# Patient Record
Sex: Male | Born: 1975 | Race: White | Hispanic: No | Marital: Married | Smoking: Never smoker
Health system: Southern US, Community
[De-identification: ages and names within clinical notes are randomized; demographics above are authoritative.]

## PROBLEM LIST (undated history)

## (undated) DIAGNOSIS — M545 Low back pain, unspecified: Secondary | ICD-10-CM

## (undated) DIAGNOSIS — E785 Hyperlipidemia, unspecified: Secondary | ICD-10-CM

## (undated) DIAGNOSIS — E663 Overweight: Secondary | ICD-10-CM

## (undated) DIAGNOSIS — I8393 Asymptomatic varicose veins of bilateral lower extremities: Secondary | ICD-10-CM

## (undated) DIAGNOSIS — E781 Pure hyperglyceridemia: Secondary | ICD-10-CM

## (undated) DIAGNOSIS — E786 Lipoprotein deficiency: Secondary | ICD-10-CM

## (undated) DIAGNOSIS — S8980XA Other specified injuries of unspecified lower leg, initial encounter: Secondary | ICD-10-CM

## (undated) DIAGNOSIS — R809 Proteinuria, unspecified: Secondary | ICD-10-CM

## (undated) DIAGNOSIS — R002 Palpitations: Secondary | ICD-10-CM

## (undated) DIAGNOSIS — E669 Obesity, unspecified: Secondary | ICD-10-CM

## (undated) DIAGNOSIS — R011 Cardiac murmur, unspecified: Secondary | ICD-10-CM

## (undated) DIAGNOSIS — I10 Essential (primary) hypertension: Secondary | ICD-10-CM

## (undated) DIAGNOSIS — R0789 Other chest pain: Secondary | ICD-10-CM

## (undated) DIAGNOSIS — K219 Gastro-esophageal reflux disease without esophagitis: Secondary | ICD-10-CM

## (undated) HISTORY — DX: Lipoprotein deficiency: E78.6

## (undated) HISTORY — DX: Overweight: E66.3

## (undated) HISTORY — DX: Hyperlipidemia, unspecified: E78.5

## (undated) HISTORY — DX: Gastro-esophageal reflux disease without esophagitis: K21.9

## (undated) HISTORY — DX: Cardiac murmur, unspecified: R01.1

## (undated) HISTORY — DX: Essential (primary) hypertension: I10

## (undated) HISTORY — DX: Proteinuria, unspecified: R80.9

## (undated) HISTORY — DX: Other chest pain: R07.89

## (undated) HISTORY — DX: Asymptomatic varicose veins of bilateral lower extremities: I83.93

## (undated) HISTORY — DX: Other specified injuries of unspecified lower leg, initial encounter: S89.80XA

## (undated) HISTORY — DX: Palpitations: R00.2

## (undated) HISTORY — DX: Pure hyperglyceridemia: E78.1

## (undated) HISTORY — DX: Obesity, unspecified: E66.9

## (undated) HISTORY — DX: Low back pain, unspecified: M54.50

---
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
## 1998-02-11 ENCOUNTER — Other Ambulatory Visit: Admission: RE | Admit: 1998-02-11 | Discharge: 1998-02-11 | Payer: Self-pay | Admitting: Internal Medicine

## 2002-07-23 ENCOUNTER — Other Ambulatory Visit: Admission: RE | Admit: 2002-07-23 | Discharge: 2002-07-23 | Payer: Self-pay | Admitting: Dermatology

## 2011-06-13 ENCOUNTER — Emergency Department (HOSPITAL_COMMUNITY)
Admission: EM | Admit: 2011-06-13 | Discharge: 2011-06-13 | Disposition: A | Discharge status: Home / Self Care | Payer: BC Managed Care – PPO | Attending: Emergency Medicine | Admitting: Emergency Medicine

## 2011-06-13 ENCOUNTER — Encounter: Payer: Self-pay | Admitting: *Deleted

## 2011-06-13 DIAGNOSIS — S61209A Unspecified open wound of unspecified finger without damage to nail, initial encounter: Secondary | ICD-10-CM | POA: Insufficient documentation

## 2011-06-13 DIAGNOSIS — W261XXA Contact with sword or dagger, initial encounter: Secondary | ICD-10-CM | POA: Insufficient documentation

## 2011-06-13 DIAGNOSIS — S61011A Laceration without foreign body of right thumb without damage to nail, initial encounter: Secondary | ICD-10-CM

## 2011-06-13 DIAGNOSIS — W260XXA Contact with knife, initial encounter: Secondary | ICD-10-CM | POA: Insufficient documentation

## 2011-06-13 MED ORDER — TETANUS-DIPHTH-ACELL PERTUSSIS 5-2.5-18.5 LF-MCG/0.5 IM SUSP
0.5000 mL | Freq: Once | INTRAMUSCULAR | Status: AC
  Administered 2011-06-13: 0.5 mL via INTRAMUSCULAR
  Filled 2011-06-13 (×2): qty 0.5

## 2011-06-13 MED ORDER — BACITRACIN ZINC 500 UNIT/GM EX OINT
1.0000 "application " | TOPICAL_OINTMENT | Freq: Two times a day (BID) | CUTANEOUS | Status: DC
  Administered 2011-06-13: 1 via TOPICAL
  Filled 2011-06-13: qty 15

## 2011-06-13 NOTE — ED Notes (Signed)
Unwrapped hand and redressed with sterile dressing and kerlex.

## 2011-06-13 NOTE — ED Provider Notes (Signed)
History     CSN: [id number] Arrival date & time: 06/13/2011  7:44 PM   First MD Initiated Contact with Patient 06/13/11 2200      Chief Complaint  Patient presents with  . Extremity Laceration    (Consider location/radiation/quality/duration/timing/severity/associated sxs/prior treatment) HPI Comments: Left handed patient reports he was trying to pry two frozen biscuits apart when the knife slipped and he cut the base of his right thumb.  Denies any difficulty moving thumb, decreased strength, numbness or tingling.  Denies other injury.    The history is provided by the patient.    History reviewed. No pertinent past medical history.  History reviewed. No pertinent past surgical history.  No family history on file.  History  Substance Use Topics  . Smoking status: Not on file  . Smokeless tobacco: Not on file  . Alcohol Use: Not on file      Review of Systems  All other systems reviewed and are negative.    Allergies  Review of patient's allergies indicates no known allergies.  Home Medications   Current Outpatient Rx  Name Route Sig Dispense Refill  . ACETAMINOPHEN 325 MG PO TABS Oral Take 650 mg by mouth every 6 (six) hours as needed.      . MULTIVITAMINS PO CAPS Oral Take 1 capsule by mouth daily.        BP 143/95  Pulse 81  Temp(Src) 98.1 F (36.7 C) (Oral)  Resp 16  SpO2 99%  Physical Exam  Nursing note and vitals reviewed. Constitutional: He is oriented to person, place, and time. He appears well-developed and well-nourished.  HENT:  Head: Normocephalic and atraumatic.  Neck: Neck supple.  Pulmonary/Chest: Effort normal.  Neurological: He is alert and oriented to person, place, and time.  Skin:       1.5 cm laceration on palmar surface at base of right thumb.  Full AROM, strength 5/5, sensation intact, capillary refill < 2 seconds.      ED Course  Procedures (including critical care time) LACERATION REPAIR Performed by: Rise Patience Consent: Verbal consent obtained. Risks and benefits: risks, benefits and alternatives were discussed Patient identity confirmed: provided demographic data Time out performed prior to procedure Prepped and Draped in normal sterile fashion Wound explored  Laceration Location: right thumb  Laceration Length: 1.5cm  No Foreign Bodies seen or palpated  Anesthesia: local infiltration  Local anesthetic: lidocaine 1% no epinephrine  Irrigation method: syringe Amount of cleaning: standard  Skin closure: prolene 6-0  Number of sutures or staples: 6  Technique: simple interrupted  Patient tolerance: Patient tolerated the procedure well with no immediate complications.  Labs Reviewed - No data to display No results found.   1. Laceration of right thumb       MDM  Laceration of right thumb with no tendon or neurological involvement.  Laceration repaired, tetanus vx given.          Dillard Cannon [place], [place] 06/13/11 2336

## 2011-06-13 NOTE — ED Notes (Signed)
Pt was home and attempting to separate two frozen biscuits with a table knife. Knife slipped and cut between right big thumb and index finger. Pt's wife wrapped hand and pt came to the ER for further evaluation. Pt is concerned that he may have incurred nerve damage. Pt able to wiggle all fingers on right hand. Right hand sensation intact. Laceration no longer bleeding. Laceration appears to be approximately 1/8 inch deep. Pt denies nausea or vomiting with the pain.

## 2011-06-14 NOTE — ED Provider Notes (Signed)
Medical screening examination/treatment/procedure(s) were performed by non-physician practitioner and as supervising physician I was immediately available for consultation/collaboration.   Ionia Schey, MD 06/14/11 0654 

## 2013-11-19 ENCOUNTER — Other Ambulatory Visit: Payer: Self-pay | Admitting: Internal Medicine

## 2013-11-19 DIAGNOSIS — R109 Unspecified abdominal pain: Secondary | ICD-10-CM

## 2013-11-20 ENCOUNTER — Encounter (INDEPENDENT_AMBULATORY_CARE_PROVIDER_SITE_OTHER): Payer: Self-pay

## 2013-11-20 ENCOUNTER — Ambulatory Visit
Admission: RE | Admit: 2013-11-20 | Discharge: 2013-11-20 | Disposition: A | Discharge status: Home / Self Care | Payer: BC Managed Care – PPO | Source: Ambulatory Visit | Attending: Internal Medicine | Admitting: Internal Medicine

## 2013-11-20 DIAGNOSIS — R109 Unspecified abdominal pain: Secondary | ICD-10-CM

## 2014-03-01 ENCOUNTER — Encounter: Payer: Self-pay | Admitting: *Deleted

## 2014-03-10 ENCOUNTER — Ambulatory Visit (INDEPENDENT_AMBULATORY_CARE_PROVIDER_SITE_OTHER): Payer: BC Managed Care – PPO | Admitting: Cardiology

## 2014-03-10 ENCOUNTER — Encounter: Payer: Self-pay | Admitting: Cardiology

## 2014-03-10 VITALS — BP 122/90 | HR 70 | Ht 69.0 in | Wt 213.0 lb

## 2014-03-10 DIAGNOSIS — E781 Pure hyperglyceridemia: Secondary | ICD-10-CM

## 2014-03-10 DIAGNOSIS — E669 Obesity, unspecified: Secondary | ICD-10-CM

## 2014-03-10 DIAGNOSIS — R002 Palpitations: Secondary | ICD-10-CM | POA: Insufficient documentation

## 2014-03-10 HISTORY — DX: Obesity, unspecified: E66.9

## 2014-03-10 HISTORY — DX: Pure hyperglyceridemia: E78.1

## 2014-03-10 NOTE — Progress Notes (Signed)
      [address], [address] [place], [place]  [address] Phone: [phone] Fax:  [phone]  Date:  03/10/2014   ID:  Ian Day, DOB August 08, 1975, MRN [id number]  PCP:  No primary provider on file.   History of Present Illness: Ian Day is a 38 y.o. male here for followup of palpitations. Previously, after vacation he came back and he on sensation of shortness of breath. He seemed to have skipped beats. Intermittent. Prior history of heart murmur as a child. These palpitations are accompanied with a vague sense of uneasiness in his chest. Electrolytes were normal. Triglycerides were elevated at 452. Had cut down coffee and less noticable. Notices at work. Would feel it skip every thrid or fourth beat. No tachycardia. Tightness of chest noticed with these and feels some SOB, mild, feels like he needs to take a deeper breath. May feel a head ache with these.  Previous description was most likely ventricular trigeminy or quadrigeminy. No syncope. Chest pain was atypical. I monitoring his symptoms.   Had NUC stress and ECHO both reassuring.   Palpitations have improved,  No SOB. Rare palpitations. Caffeine intake is down. Stress is down. Changing to Dr. Tenny Craw.   Wt Readings from Last 3 Encounters:  03/10/14 213 lb (96.616 kg)     Past Medical History  Diagnosis Date  . Heart murmur   . Knee hyperextension injury   . GERD (gastroesophageal reflux disease)     No past surgical history on file.  Current Outpatient Prescriptions  Medication Sig Dispense Refill  . esomeprazole (NEXIUM) 20 MG capsule Take 20 mg by mouth daily at 12 noon.       No current facility-administered medications for this visit.    Allergies:   No Known Allergies  Social History:  The patient  reports that he has never smoked. He does not have any smokeless tobacco history on file. He reports that he does not drink alcohol or use illicit drugs.   Family History  Problem Relation Age of  Onset  . Hypertension Father   . Heart attack Paternal Grandmother   . Heart attack Paternal Grandfather     ROS:  Please see the history of present illness.   Denies any syncope, bleeding, orthopnea, PND   All other systems reviewed and negative.   PHYSICAL EXAM: VS:  BP 122/90  Pulse 70  Ht  (1.753 m)  Wt 213 lb (96.616 kg)  BMI 31.44 kg/m2 Well nourished, well developed, in no acute distress HEENT: normal, [place]/AT, EOMI Neck: no JVD, normal carotid upstroke, no bruit Cardiac:  normal S1, S2; RRR; no murmur Lungs:  clear to auscultation bilaterally, no wheezing, rhonchi or rales Abd: soft, nontender, no hepatomegaly, no bruits Ext: no edema, 2+ distal pulses Skin: warm and dry GU: deferred Neuro: no focal abnormalities noted, AAO x 3  EKG:  03/10/14 - NSR, no other changes.    ASSESSMENT AND PLAN:  1. Palpitations-after decreasing stress, caffeine, doing very well. No medications at this point. Reassuring previous workup. We will see him back on as-needed basis. 2. Hypertriglycerides-recent bloodwork per patient reassuring. Diet. Previous fish oil has precipitated gout attack he states. 3. Obesity-weight has increased. Continue to monitor this. Decrease wheat products/carbohydrates. 4. We will see him back on as-needed basis.  Signed, Donato Schultz, MD [facility]  03/10/2014 10:05 AM

## 2014-03-10 NOTE — Patient Instructions (Signed)
The current medical regimen is effective;  continue present plan and medications.  Follow up as needed 

## 2014-03-24 ENCOUNTER — Encounter: Payer: Self-pay | Admitting: Cardiology

## 2014-09-02 ENCOUNTER — Ambulatory Visit (INDEPENDENT_AMBULATORY_CARE_PROVIDER_SITE_OTHER): Payer: BLUE CROSS/BLUE SHIELD | Admitting: Cardiology

## 2014-09-02 ENCOUNTER — Encounter: Payer: Self-pay | Admitting: Cardiology

## 2014-09-02 VITALS — BP 132/88 | HR 78 | Ht 69.0 in | Wt 213.0 lb

## 2014-09-02 DIAGNOSIS — E781 Pure hyperglyceridemia: Secondary | ICD-10-CM

## 2014-09-02 DIAGNOSIS — R0789 Other chest pain: Secondary | ICD-10-CM

## 2014-09-02 DIAGNOSIS — R002 Palpitations: Secondary | ICD-10-CM

## 2014-09-02 DIAGNOSIS — E669 Obesity, unspecified: Secondary | ICD-10-CM | POA: Insufficient documentation

## 2014-09-02 HISTORY — DX: Palpitations: R00.2

## 2014-09-02 HISTORY — DX: Other chest pain: R07.89

## 2014-09-02 NOTE — Patient Instructions (Signed)
The current medical regimen is effective;  continue present plan and medications.  A chest x-ray takes a picture of the organs and structures inside the chest, including the heart, lungs, and blood vessels. This test can show several things, including, whether the heart is enlarges; whether fluid is building up in the lungs; and whether pacemaker / defibrillator leads are still in place.  Follow up in 6 months with Dr. Anne FuSkains.  You will receive a letter in the mail 2 months before you are due.  Please call us when you receive this letter to schedule your follow up appointment.  Thank you for choosing [facility]!!

## 2014-09-02 NOTE — Progress Notes (Signed)
[address] [place], [place]  [id number] Phone: [phone] Fax:  [phone]) [phone]  Date:  09/02/2014   ID:  Ian CoxJonathan R Yaun, DOB 1975/12/16, MRN [id number]  PCP:   Duane Lopeoss, Alan, MD   History of Present Illness: Ian Day is a 39 y.o. male here for followup of chest pain. Previously, after vacation he came back and he on sensation of shortness of breath. He seemed to have skipped beats. Intermittent. Prior history of heart murmur as a child. These palpitations are accompanied with a vague sense of uneasiness in his chest. Electrolytes were normal. Triglycerides were elevated at 452. Had cut down coffee and less noticable. Notices at work. Would feel it skip every thrid or fourth beat. No tachycardia. Tightness of chest noticed with these and feels some SOB, mild, feels like he needs to take a deeper breath. May feel a head ache with these.  Previous description was most likely ventricular trigeminy or quadrigeminy. No syncope. Chest pain was atypical. I monitoring his symptoms.   Had NUC stress and ECHO both reassuring.   Palpitations have improved,  but occasionally he will feel chest discomfort that will last approximately 3 days duration, he will wake up and it will spontaneously go away. Sometimes he feels in his arms as well, hard to describe. He has had an upper GI series which showed intermittent esophageal sluggishness. He was given Nexium in the past.   Wt Readings from Last 3 Encounters:  09/02/14 213 lb (96.616 kg)  03/10/14 213 lb (96.616 kg)     Past Medical History  Diagnosis Date  . Heart murmur   . Knee hyperextension injury   . GERD (gastroesophageal reflux disease)     No past surgical history on file.  Current Outpatient Prescriptions  Medication Sig Dispense Refill  . esomeprazole (NEXIUM) 20 MG capsule Take 20 mg by mouth as needed.      No current facility-administered medications for this visit.    Allergies:   No Known  Allergies  Social History:  The patient  reports that he has never smoked. He does not have any smokeless tobacco history on file. He reports that he does not drink alcohol or use illicit drugs.   Small children at home.  Family History  Problem Relation Age of Onset  . Hypertension Father   . Heart attack Paternal Grandmother   . Heart attack Paternal Grandfather     ROS:  Please see the history of present illness.   Denies any syncope, bleeding, orthopnea, PND   All other systems reviewed and negative.   PHYSICAL EXAM: VS:  BP 132/88 mmHg  Pulse 78  Ht 5\' 9"  (1.753 m)  Wt 213 lb (96.616 kg)  BMI 31.44 kg/m2 Well nourished, well developed, in no acute distress HEENT: normal, [place]/AT, EOMI Neck: no JVD, normal carotid upstroke, no bruit Cardiac:  normal S1, S2; RRR; no murmur Lungs:  clear to auscultation bilaterally, no wheezing, rhonchi or rales Abd: soft, nontender, no hepatomegaly, no bruits Ext: no edema, 2+ distal pulses Skin: warm and dry GU: deferred Neuro: no focal abnormalities noted, AAO x 3  EKG:  03/10/14 - NSR, no other changes.    ASSESSMENT AND PLAN:  1. Chest pain-atypical, previous cardiac workup was reassuring. Could this be GI related? Given the fact that his pain sometimes can be constant over 3 days duration makes it very low likelihood for cardiac etiology. He is not changed anything in  his workout routine, exercise. He does sometimes feel shortness of breath with running or activity. I will check a chest x-ray to ensure proper structure of his chest cavity. 2. Palpitations-after decreasing stress, caffeine, doing very well. No medications at this point. Reassuring previous workup.  3. Hypertriglycerides-prior bloodwork per patient reassuring. Diet. Previous fish oil has precipitated gout attack he states. 4. Obesity-encouraged weight loss which will help overall with blood pressure.. Continue to monitor this. Decrease wheat products/carbohydrates. 5. We  will see him back 6 months  Signed, Donato Schultz, MD [facility]  09/02/2014 3:39 PM

## 2014-09-03 ENCOUNTER — Ambulatory Visit (INDEPENDENT_AMBULATORY_CARE_PROVIDER_SITE_OTHER)
Admission: RE | Admit: 2014-09-03 | Discharge: 2014-09-03 | Disposition: A | Discharge status: Home / Self Care | Payer: BLUE CROSS/BLUE SHIELD | Source: Ambulatory Visit | Attending: Cardiology | Admitting: Cardiology

## 2014-09-03 DIAGNOSIS — R0789 Other chest pain: Secondary | ICD-10-CM

## 2015-08-09 ENCOUNTER — Emergency Department (HOSPITAL_COMMUNITY)
Admission: EM | Admit: 2015-08-09 | Discharge: 2015-08-09 | Disposition: A | Discharge status: Home / Self Care | Payer: BLUE CROSS/BLUE SHIELD | Attending: Emergency Medicine | Admitting: Emergency Medicine

## 2015-08-09 ENCOUNTER — Encounter (HOSPITAL_COMMUNITY): Payer: Self-pay | Admitting: Emergency Medicine

## 2015-08-09 ENCOUNTER — Emergency Department (HOSPITAL_COMMUNITY): Payer: BLUE CROSS/BLUE SHIELD

## 2015-08-09 DIAGNOSIS — R2230 Localized swelling, mass and lump, unspecified upper limb: Secondary | ICD-10-CM | POA: Insufficient documentation

## 2015-08-09 DIAGNOSIS — R0981 Nasal congestion: Secondary | ICD-10-CM | POA: Insufficient documentation

## 2015-08-09 DIAGNOSIS — R079 Chest pain, unspecified: Secondary | ICD-10-CM | POA: Insufficient documentation

## 2015-08-09 DIAGNOSIS — R0602 Shortness of breath: Secondary | ICD-10-CM | POA: Diagnosis not present

## 2015-08-09 DIAGNOSIS — R002 Palpitations: Secondary | ICD-10-CM | POA: Insufficient documentation

## 2015-08-09 DIAGNOSIS — R61 Generalized hyperhidrosis: Secondary | ICD-10-CM | POA: Diagnosis not present

## 2015-08-09 DIAGNOSIS — Z87828 Personal history of other (healed) physical injury and trauma: Secondary | ICD-10-CM | POA: Insufficient documentation

## 2015-08-09 DIAGNOSIS — R1013 Epigastric pain: Secondary | ICD-10-CM | POA: Insufficient documentation

## 2015-08-09 DIAGNOSIS — K219 Gastro-esophageal reflux disease without esophagitis: Secondary | ICD-10-CM | POA: Insufficient documentation

## 2015-08-09 DIAGNOSIS — R200 Anesthesia of skin: Secondary | ICD-10-CM | POA: Insufficient documentation

## 2015-08-09 DIAGNOSIS — R011 Cardiac murmur, unspecified: Secondary | ICD-10-CM | POA: Insufficient documentation

## 2015-08-09 LAB — CBC
HEMATOCRIT: 45 % (ref 39.0–52.0)
Hemoglobin: 14.9 g/dL (ref 13.0–17.0)
MCH: 28 pg (ref 26.0–34.0)
MCHC: 33.1 g/dL (ref 30.0–36.0)
MCV: 84.4 fL (ref 78.0–100.0)
Platelets: 253 10*3/uL (ref 150–400)
RBC: 5.33 MIL/uL (ref 4.22–5.81)
RDW: 13.2 % (ref 11.5–15.5)
WBC: 8 10*3/uL (ref 4.0–10.5)

## 2015-08-09 LAB — BASIC METABOLIC PANEL
Anion gap: 10 (ref 5–15)
BUN: 17 mg/dL (ref 6–20)
CHLORIDE: 103 mmol/L (ref 101–111)
CO2: 29 mmol/L (ref 22–32)
Calcium: 9.5 mg/dL (ref 8.9–10.3)
Creatinine, Ser: 1.1 mg/dL (ref 0.61–1.24)
GFR calc Af Amer: >60 mL/min (ref >60)
GFR calc non Af Amer: >60 mL/min (ref >60)
GLUCOSE: 124 mg/dL — AB (ref 65–99)
POTASSIUM: 4 mmol/L (ref 3.5–5.1)
Sodium: 142 mmol/L (ref 135–145)

## 2015-08-09 LAB — I-STAT TROPONIN, ED: Troponin i, poc: 0 ng/mL (ref 0.00–0.08)

## 2015-08-09 MED ORDER — OMEPRAZOLE 20 MG PO CPDR: 20.0000 mg | DELAYED_RELEASE_CAPSULE | Freq: Every day | ORAL | Status: DC

## 2015-08-09 NOTE — ED Notes (Signed)
intermitent CP X1 month, c/o feeling flushed, CP 2/10, also SOB, A/O X4, ambulatory and in NAD

## 2015-08-09 NOTE — ED Provider Notes (Signed)
CSN: [id number]     Arrival date & time 08/09/15  1357 History   First MD Initiated Contact with Patient 08/09/15 1715     Chief Complaint  Patient presents with  . Chest Pain   (Consider location/radiation/quality/duration/timing/severity/associated sxs/prior Treatment) HPI 40 y.o. male presents to the Emergency Department today complaining of chest pain and shortness of breath for the past year and half. Notes that the pain has increased in strength and frequency for the past 3-4 weeks. Describes the pain as 2/10 dull cramping sensation substernal that radiates to right arm. Also notes some epigastric tenderness. Chest pain seems to disappear with rest, but recently occurs with both rest and exertion. Has seen Cardiologist (Dr. Dione Housekeeper) on multiple occasions for these symptoms. Associated symptoms include: diaphoresis, congestion, swollen hand/feet, and numbness/tingling. No N/V/D. No headaches. No abd pain. No fevers. No other symptoms notes. Last stress  Test was 1 year and a half ago, which was unremarkable. No hx MI. Has an appointment with Dr. Dione Housekeeper in 3 weeks.     Past Medical History  Diagnosis Date  . Heart murmur   . Knee hyperextension injury   . GERD (gastroesophageal reflux disease)    History reviewed. No pertinent past surgical history. Family History  Problem Relation Age of Onset  . Hypertension Father   . Heart attack Paternal Grandmother   . Heart attack Paternal Grandfather    Social History  Substance Use Topics  . Smoking status: Never Smoker   . Smokeless tobacco: None  . Alcohol Use: No    Review of Systems ROS reviewed and all are negative for acute change except as noted in the HPI.  Allergies  Review of patient's allergies indicates no known allergies.  Home Medications   Prior to Admission medications   Medication Sig Start Date End Date Taking? Authorizing Provider  esomeprazole (NEXIUM) 20 MG capsule Take 20 mg by mouth as needed.      Historical Provider, MD   BP 157/106 mmHg  Pulse 70  Temp(Src) 98.2 F (36.8 C) (Oral)  Resp 21  Ht  (1.753 m)  Wt 97.523 kg  BMI 31.74 kg/m2  SpO2 99%]  Physical Exam  Constitutional: He is oriented to person, place, and time. He appears well-developed and well-nourished.  HENT:  Head: Normocephalic and atraumatic.  Eyes: EOM are normal.  Neck: Normal range of motion.  Cardiovascular: Normal rate, regular rhythm, S1 normal, S2 normal, normal heart sounds, intact distal pulses and normal pulses.   Pulmonary/Chest: Effort normal and breath sounds normal. He has no decreased breath sounds. He has no wheezes. He has no rhonchi. He has no rales. He exhibits no tenderness.  Abdominal: Soft. Normal appearance. There is tenderness in the epigastric area. There is no rebound, no CVA tenderness, no tenderness at McBurney's point and negative Murphy's sign.  Musculoskeletal: Normal range of motion.  Neurological: He is alert and oriented to person, place, and time.  Skin: Skin is warm and dry.  Non blanching rash noted on bilateral ankles. Similar pattern on both.   Psychiatric: He has a normal mood and affect. His behavior is normal. Thought content normal.  Nursing note and vitals reviewed.   ED Course  Procedures (including critical care time) Labs Review Labs Reviewed  BASIC METABOLIC PANEL - Abnormal; Notable for the following:    Glucose, Bld 124 (*)    All other components within normal limits  CBC  I-STAT TROPOININ, ED    Imaging Review Dg Chest  2 View  08/09/2015  CLINICAL DATA:  Chest pain for 1 year, more severe past 2 weeks EXAM: CHEST  2 VIEW COMPARISON:  September 03, 2014 FINDINGS: There is no edema or consolidation. Heart size and pulmonary vascularity are normal. No adenopathy. No pneumothorax. No bone lesions. IMPRESSION: No edema or consolidation. Electronically Signed   By: Bretta Bang III M.D.   On: 08/09/2015 14:57   I have personally reviewed and  evaluated these images and lab results as part of my medical decision-making.   EKG Interpretation   Date/Time:  Tuesday August 09 2015 14:38:14 EST Ventricular Rate:  71 PR Interval:  164 QRS Duration: 108 QT Interval:  402 QTC Calculation: 436 R Axis:   65 Text Interpretation:  Normal sinus rhythm Incomplete right bundle branch  block Borderline ECG No previous ECGs available Confirmed by ZACKOWSKI   MD, SCOTT ([id number]) on 08/09/2015 6:04:30 PM      MDM  I have reviewed relevant laboratory values. I have reviewed relevant imaging studies. I personally interpreted the relevant EKG. I have reviewed the relevant previous healthcare records. I obtained HPI from historian. Patient discussed with supervising physician  ED Course:  Assessment: 61y male presents with worsening chest pain and shortness of breath for the past 3-4 weeks. He has been seeing Cardiologist (Dr.Skaines) for treatment of symptoms. Has had Stress and Echo done in the past year and a half, which were unremarkable. Trop today negative. EKG unremarkable. CXR unremarkable. Patient is to be discharged with recommendation to follow up with cardiologist in regards to today's hospital visit. Perc negative, VSS, no tracheal deviation, no JVD or new murmur, RRR, breath sounds equal bilaterally, EKG without acute abnormalities, negative troponin, and negative CXR. Pt has been advised start a PPI and return to the ED is CP becomes exertional, associated with diaphoresis or nausea, radiates to left jaw/arm, worsens or becomes concerning in any way. Pt appears reliable for follow up and is agreeable to discharge.   Disposition/Plan:  DC Home Additional Verbal discharge instructions given and discussed with patient.  Pt Instructed to follow up with Cardiologist at scheduled appointment  Return precautions given Pt acknowledges and agrees with plan  Supervising Physician Vanetta Mulders, MD   Final diagnoses:  Palpitations   Chest pain, unspecified chest pain type      Audry Pili, PA-C 08/09/15 1825  Vanetta Mulders, MD 08/13/15 [phone]

## 2015-08-09 NOTE — Discharge Instructions (Signed)
Please read and follow all provided instructions.  Your diagnoses today include:  1. Palpitations   2. Chest pain, unspecified chest pain type    Tests performed today include:  An EKG of your heart  A chest x-ray  Cardiac enzymes - a blood test for heart muscle damage  Blood counts and electrolytes  Vital signs. See below for your results today.   Medications prescribed:   Take any prescribed medications only as directed.  Follow-up instructions: Please follow-up with your Cardiologist at your scheduled appointment   Return instructions:  SEEK IMMEDIATE MEDICAL ATTENTION IF:  You have severe chest pain, especially if the pain is crushing or pressure-like and spreads to the arms, back, neck, or jaw, or if you have sweating, nausea (feeling sick to your stomach), or shortness of breath. THIS IS AN EMERGENCY. Don't wait to see if the pain will go away. Get medical help at once. Call 911 or 0 (operator). DO NOT drive yourself to the hospital.   Your chest pain gets worse and does not go away with rest.   You have an attack of chest pain lasting longer than usual, despite rest and treatment with the medications your caregiver has prescribed.   You wake from sleep with chest pain or shortness of breath.  You feel dizzy or faint.  You have chest pain not typical of your usual pain for which you originally saw your caregiver.   You have any other emergent concerns regarding your health.  Additional Information: Chest pain comes from many different causes. Your caregiver has diagnosed you as having chest pain that is not specific for one problem, but does not require admission.  You are at low risk for an acute heart condition or other serious illness.   Your vital signs today were: BP 157/106 mmHg   Pulse 70   Temp(Src) 98.2 F (36.8 C) (Oral)   Resp 21   Ht  (1.753 m)   Wt 97.523 kg   BMI 31.74 kg/m2   SpO2 99% If your blood pressure (BP) was elevated above 135/85 this  visit, please have this repeated by your doctor within one month. --------------

## 2015-08-30 ENCOUNTER — Ambulatory Visit: Payer: BLUE CROSS/BLUE SHIELD | Admitting: Cardiology

## 2016-11-13 ENCOUNTER — Telehealth: Payer: Self-pay

## 2016-11-13 NOTE — Telephone Encounter (Signed)
SENT NOTES TO SCHEDULING 

## 2016-11-21 ENCOUNTER — Encounter: Payer: Self-pay | Admitting: Cardiology

## 2016-11-21 ENCOUNTER — Encounter (INDEPENDENT_AMBULATORY_CARE_PROVIDER_SITE_OTHER): Payer: Self-pay

## 2016-11-21 ENCOUNTER — Ambulatory Visit (INDEPENDENT_AMBULATORY_CARE_PROVIDER_SITE_OTHER): Payer: BLUE CROSS/BLUE SHIELD | Admitting: Cardiology

## 2016-11-21 VITALS — BP 130/80 | HR 69 | Ht 69.0 in | Wt 218.2 lb

## 2016-11-21 DIAGNOSIS — E669 Obesity, unspecified: Secondary | ICD-10-CM

## 2016-11-21 DIAGNOSIS — R002 Palpitations: Secondary | ICD-10-CM | POA: Diagnosis not present

## 2016-11-21 DIAGNOSIS — E781 Pure hyperglyceridemia: Secondary | ICD-10-CM

## 2016-11-21 MED ORDER — METOPROLOL SUCCINATE ER 25 MG PO TB24: 25.0000 mg | ORAL_TABLET | Freq: Every day | ORAL | 3 refills | Status: DC | PRN

## 2016-11-21 NOTE — Patient Instructions (Signed)
Medication Instructions:  You may take Metoprolol succinate 25 mg once a day as needed for palpitations. Continue all other medications as listed.  Follow-Up: Follow up in 6 months with Dr. Anne FuSkains.  You will receive a letter in the mail 2 months before you are due.  Please call us when you receive this letter to schedule your follow up appointment.  If you need a refill on your cardiac medications before your next appointment, please call your pharmacy.  Thank you for choosing [facility]!!

## 2016-11-21 NOTE — Progress Notes (Signed)
[address], [address] [place], [place]  [address] Phone: [phone] Fax:  [phone]  Date:  11/21/2016   ID:  Ian Day, DOB February 23, 1976, MRN [id number]  PCP:  Ileana Ladd, MD   History of Present Illness: Ian Day is a 41 y.o. male here for followup of palpitations. Has had a workup that was negative for metanephrines.  Fairly similar to the way he was describing them previously. They seem to be sporadic, come and go. They may last a few days in duration 2 weeks then go away. Nothing seems to bring the moment specifically. His caffeine consumption has been reduced significantly but he does notice that he drinks more caffeine they can worsen. No significant dietary changes. They're more of an annoyance than anything.  Sometimes he feels a slight pressure in his chest. Nonexertional. Rare. His job has been stressful. 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  Previously, after vacation he came back and he on sensation of shortness of breath. He seemed to have skipped beats. Intermittent. Prior history of heart murmur as a child. These palpitations are accompanied with a vague sense of uneasiness in his chest. Electrolytes were normal. Triglycerides were elevated at 452. Had cut down coffee and less noticable. Notices at work. Would feel it skip every thrid or fourth beat. No tachycardia. Tightness of chest noticed with these and feels some SOB, mild, feels like he needs to take a deeper breath. May feel a head ache with these.  Previous description was most likely ventricular trigeminy or quadrigeminy. No syncope.  Had NUC stress and ECHO both reassuring.    Wt Readings from Last 3 Encounters:  11/21/16 218 lb 3.2 oz (99 kg)  08/09/15 215 lb (97.5 kg)  09/02/14 213 lb (96.6 kg)     Past Medical History:  Diagnosis Date  . Atypical chest pain 09/02/2014  . GERD (gastroesophageal reflux disease)   . Heart murmur   .  Hypertriglyceridemia 03/10/2014  . Knee hyperextension injury   . Obesity, unspecified 03/10/2014  . Palpitations 09/02/2014    History reviewed. No pertinent surgical history.  Current Outpatient Prescriptions  Medication Sig Dispense Refill  . metoprolol succinate (TOPROL-XL) 25 MG 24 hr tablet Take 1 tablet (25 mg total) by mouth daily as needed. 30 tablet 3   No current facility-administered medications for this visit.     Allergies:   No Known Allergies  Social History:  The patient  reports that he has never smoked. He has never used smokeless tobacco. He reports that he does not drink alcohol or use drugs.   Small children at home.  Family History  Problem Relation Age of Onset  . Hypertension Mother   . Hyperlipidemia Mother   . Hypertension Father   . Heart attack Paternal Grandmother   . Heart attack Paternal Grandfather     ROS:  Please see the history of present illness.   Denies any syncope, bleeding, orthopnea, PND   Unless stated above all other review of systems negative  PHYSICAL EXAM: VS:  BP 130/80   Pulse 69   Ht 5\' 9"  (1.753 m)   Wt 218 lb 3.2 oz (99 kg)   BMI 32.22 kg/m  Well nourished, well developed, in no acute distress  HEENT: normal, [place]/AT, EOMI Neck: no JVD, normal carotid upstroke, no bruit Cardiac:  normal S1, S2; RRR; no murmur  Lungs:  clear to auscultation bilaterally, no wheezing,  rhonchi or rales  Abd: soft, nontender, no hepatomegaly, no bruits  Ext: no edema, 2+ distal pulses Skin: warm and dry  GU: deferred Neuro: no focal abnormalities noted, AAO x 3  EKG:  11/21/16, today-normal sinus rhythm with no other abnormalities personally viewed-03/10/14 - NSR, no other changes.    Prior office notes reviewed, lab work reviewed-hemoglobin 15.6, TSH normal. Metanephrines normal.   ASSESSMENT AND PLAN:  Palpitations  - Fairly long-standing, by history appeared to be PVCs or PACs. We discussed the possibility of monitor but he is  currently not having any palpitations. He does not describe any high risk symptoms such as syncope associated with these.  - I will prescribe him Toprol-XL 25 mg once a day when necessary  - Continue with dietary modification such as caffeine reduction. Try to avoid Sudafed.  - Try to get good sleep, good sleep patterns. His wife does note that he snores occasionally. He does not have any significant daytime somnolence.  - He does sometimes feel anxious and has a sensation like his face is flushed and he feels some tingling in his nose and his mouth. Sometimes this is associated with anxiety and hyperventilation.  - Overall his recent cardiac workup has been reassuring with nuclear stress test and echocardiogram. I do not appreciate any new murmurs.  -He continues to exercise up to 3 days a week. Excellent.  Hypertriglycerides- -prior bloodwork per patient reassuring. Diet. Previous fish oil has precipitated gout attack he states.Continue to work on dietary modifications.   Obesity -BMI slightly greater than 31. Continue exercise, weight loss will be helpful.   We will see him back 6 months  Signed, Donato SchultzMark Ginna Schuur, MD [facility]  11/21/2016 5:01 PM

## 2017-01-18 ENCOUNTER — Other Ambulatory Visit: Payer: Self-pay | Admitting: Cardiology

## 2017-01-18 MED ORDER — METOPROLOL SUCCINATE ER 25 MG PO TB24: 25.0000 mg | ORAL_TABLET | Freq: Every day | ORAL | 2 refills | Status: DC | PRN

## 2017-06-25 ENCOUNTER — Ambulatory Visit: Payer: BLUE CROSS/BLUE SHIELD | Admitting: Cardiology

## 2019-10-01 ENCOUNTER — Encounter: Payer: Self-pay | Admitting: General Practice

## 2019-10-28 ENCOUNTER — Other Ambulatory Visit: Payer: Self-pay

## 2019-10-28 ENCOUNTER — Encounter: Payer: Self-pay | Admitting: Cardiology

## 2019-10-28 ENCOUNTER — Ambulatory Visit (INDEPENDENT_AMBULATORY_CARE_PROVIDER_SITE_OTHER): Payer: BC Managed Care – PPO | Admitting: Cardiology

## 2019-10-28 VITALS — BP 138/90 | HR 72 | Ht 69.0 in | Wt 199.8 lb

## 2019-10-28 DIAGNOSIS — R079 Chest pain, unspecified: Secondary | ICD-10-CM | POA: Diagnosis not present

## 2019-10-28 DIAGNOSIS — Z01812 Encounter for preprocedural laboratory examination: Secondary | ICD-10-CM

## 2019-10-28 DIAGNOSIS — R002 Palpitations: Secondary | ICD-10-CM | POA: Diagnosis not present

## 2019-10-28 DIAGNOSIS — R0602 Shortness of breath: Secondary | ICD-10-CM

## 2019-10-28 DIAGNOSIS — R0789 Other chest pain: Secondary | ICD-10-CM

## 2019-10-28 MED ORDER — METOPROLOL TARTRATE 100 MG PO TABS: 100.0000 mg | ORAL_TABLET | Freq: Once | ORAL | 0 refills | Status: DC

## 2019-10-28 NOTE — Progress Notes (Signed)
[place] [place], [place]  [address] Phone: [phone] Fax:  [phone]  Date:  10/28/2019   ID:  Ian Day, DOB 02-19-1976, MRN [id number]  PCP:  Vernie Shanks, MD   History of Present Illness: Ian Day is a 44 y.o. male here for followup of palpitations, chest pain, dyspnea. Has had a workup in the past that was negative for metanephrines.  In 2014 had NUC stress and ECHO both reassuring.   Today, 10/28/19 -continues to have random palpitations. Skips. Fluttering. Sitting in chair, speed up and slow down. Last a week and go away. Random. Chest tightness comes and goes. Random. Seconds to few hours.  Moderate in intensity at times.  When eat, some impact with heart changes. Can trigger discomfort or palpitations. Does not have known reflux. Fullness.   General leg pain, like head ache.  Distal pulses are normal.  Sometimes tingling in face around mouth nose.   Exercises regularly.    Wt Readings from Last 3 Encounters:  10/28/19 199 lb 12.8 oz (90.6 kg)  11/21/16 218 lb 3.2 oz (99 kg)  08/09/15 215 lb (97.5 kg)     Past Medical History:  Diagnosis Date  . Atypical chest pain 09/02/2014  . Dyslipidemia   . GERD (gastroesophageal reflux disease)   . Heart murmur   . Heart murmur   . Hypertension   . Hypertriglyceridemia 03/10/2014  . Knee hyperextension injury   . Low back pain   . Low HDL (under 40)   . Obesity, unspecified 03/10/2014  . Overweight   . Palpitations 09/02/2014  . Proteinuria   . Varicose veins of both lower extremities     No past surgical history on file.  Current Outpatient Medications  Medication Sig Dispense Refill  . AMLODIPINE BESYLATE PO Take 5 mg by mouth.    . metoprolol tartrate (LOPRESSOR) 100 MG tablet Take 1 tablet (100 mg total) by mouth once for 1 dose. Take one tablet 2 hours before your CT scan 1 tablet 0   No current facility-administered medications for this visit.    Allergies:    No Known Allergies  Social History:  The patient  reports that he has never smoked. He has never used smokeless tobacco. He reports that he does not drink alcohol or use drugs.   Small children at home.  Family History  Problem Relation Age of Onset  . Hypertension Mother   . Hyperlipidemia Mother   . Hypertension Father   . Heart attack Paternal Grandmother   . Heart attack Paternal Grandfather     ROS:  Please see the history of present illness.   Denies any syncope, bleeding, orthopnea, PND   Unless stated above all other review of systems negative  PHYSICAL EXAM: VS:  BP 138/90   Pulse 72   Ht 5\' 9"  (1.753 m)   Wt 199 lb 12.8 oz (90.6 kg)   SpO2 99%   BMI 29.51 kg/m  Well nourished, well developed, in no acute distress  HEENT: normal, [place]/AT, EOMI Neck: no JVD, normal carotid upstroke, no bruit Cardiac:  normal S1, S2; RRR; no murmur  Lungs:  clear to auscultation bilaterally, no wheezing, rhonchi or rales  Abd: soft, nontender, no hepatomegaly, no bruits  Ext: no edema, 2+ distal pulses Skin: warm and dry  GU: deferred Neuro: no focal abnormalities noted, AAO x 3  EKG: Echocardiogram 10/13/2019-personally reviewed from outside source demonstrates sinus rhythm 62  bpm with no other abnormalities.  11/21/16-normal sinus rhythm with no other abnormalities personally viewed-03/10/14 - NSR, no other changes.    Prior office notes reviewed, lab work reviewed-hemoglobin 15.6, TSH normal. Metanephrines normal.   ASSESSMENT AND PLAN:  Palpitations  -Could be PVCs or PACs or potentially paroxysmal atrial tachycardia.  I will check a Zio patch monitor to ensure that there are no adverse arrhythmias present.  These have been quite longstanding.  - He does sometimes feel anxious and has a sensation like his face is flushed and he feels some tingling in his nose and his mouth. Sometimes this is associated with anxiety and hyperventilation.   Atypical chest pain -Sometimes he does  have a fullness centrally after eating but often will have random chest discomfort sometimes lasting a few seconds sometimes lasting up to an hour that is moderate in intensity.  We will go ahead and check a CT scan with possible FFR analysis.  This will help guide further therapies.  Shortness of breath -Once again does occur at random.  Does not necessarily have to be with exertion.  He is able to exercise.  He can be sitting on the couch and then get the sensation of trouble getting air in.  We will repeat echocardiogram since it has been over 6 years.  Hypertriglycerides- -prior bloodwork per patient reassuring. Diet. Previous fish oil has precipitated gout attack he states.Continue to work on dietary modifications.  Most recent triglycerides 95.  Excellent.  LDL 112   We will see him back 2 months  Signed, Donato Schultz, MD [facility]  10/28/2019 5:20 PM

## 2019-10-28 NOTE — Patient Instructions (Addendum)
Medication Instructions:  The current medical regimen is effective;  continue present plan and medications.  *If you need a refill on your cardiac medications before your next appointment, please call your pharmacy*  Testing/Procedures: Your physician has requested that you have Coronary CT. Cardiac computed tomography (CT) is a painless test that uses an x-ray machine to take clear, detailed pictures of your heart.  Your physician has requested that you have an echocardiogram. Echocardiography is a painless test that uses sound waves to create images of your heart. It provides your doctor with information about the size and shape of your heart and how well your heart's chambers and valves are working. This procedure takes approximately one hour. There are no restrictions for this procedure.  ZIO XT- Long Term Monitor Instructions   Your physician has requested you wear your ZIO patch monitor 14 days.   This is a single patch monitor.  [employer] supplies one patch monitor per enrollment.  Additional stickers are not available.   Please do not apply patch if you will be having a Nuclear Stress Test, Echocardiogram, Cardiac CT, MRI, or Chest Xray during the time frame you would be wearing the monitor. The patch cannot be worn during these tests.  You cannot remove and re-apply the ZIO XT patch monitor.   Your ZIO patch monitor will be sent USPS Priority mail from [facility] directly to your home address. The monitor may also be mailed to a PO BOX if home delivery is not available.   It may take 3-5 days to receive your monitor after you have been enrolled.   Once you have received you monitor, please review enclosed instructions.  Your monitor has already been registered assigning a specific monitor serial # to you.   Applying the monitor   Shave hair from upper left chest.   Hold abrader disc by orange tab.  Rub abrader in 40 strokes over left upper chest as indicated in your monitor  instructions.   Clean area with 4 enclosed alcohol pads .  Use all pads to assure are is cleaned thoroughly.  Let dry.   Apply patch as indicated in monitor instructions.  Patch will be place under collarbone on left side of chest with arrow pointing upward.   Rub patch adhesive wings for 2 minutes.Remove white label marked "1".  Remove white label marked "2".  Rub patch adhesive wings for 2 additional minutes.   While looking in a mirror, press and release button in center of patch.  A small green light will flash 3-4 times .  This will be your only indicator the monitor has been turned on.     Do not shower for the first 24 hours.  You may shower after the first 24 hours.   Press button if you feel a symptom. You will hear a small click.  Record Date, Time and Symptom in the Patient Log Book.   When you are ready to remove patch, follow instructions on last 2 pages of Patient Log Book.  Stick patch monitor onto last page of Patient Log Book.   Place Patient Log Book in Agar box.  Use locking tab on box and tape box closed securely.  The [employer] and [employer] has [employer] on it.  Please place in mailbox as soon as possible.  Your physician should have your test results approximately 7 days after the monitor has been mailed back to [facility].   Call [place] at [phone] if  you have questions regarding your ZIO XT patch monitor.  Call them immediately if you see an orange light blinking on your monitor.   If your monitor falls off in less than 4 days contact our Monitor department at [phone].  If your monitor becomes loose or falls off after 4 days call [employer] at [phone] for suggestions on securing your monitor.   Follow-Up: At [facility], you and your health needs are our priority.  As part of our continuing mission to provide you with exceptional heart care, we have created designated Provider Care Teams.  These Care Teams include your  primary Cardiologist (physician) and Advanced Practice Providers (APPs -  Physician Assistants and Nurse Practitioners) who all work together to provide you with the care you need, when you need it.  We recommend signing up for the patient portal called "MyChart".  Sign up information is provided on this After Visit Summary.  MyChart is used to connect with patients for Virtual Visits (Telemedicine).  Patients are able to view lab/test results, encounter notes, upcoming appointments, etc.  Non-urgent messages can be sent to your provider as well.   To learn more about what you can do with MyChart, go to [email or web address].    Your next appointment:   2 month(s)  The format for your next appointment:   In Person  Provider:   Donato Schultz, MD   Thank you for choosing [facility]!!    Your cardiac CT will be scheduled at:   [facility] [address] [place], [place] [address] [phone]  Please arrive at the [facility] main entrance of [facility] 30 minutes prior to test start time. Proceed to the [facility] Radiology Department (first floor) to check-in and test prep.  Please follow these instructions carefully (unless otherwise directed):  Hold all erectile dysfunction medications at least 3 days (72 hrs) prior to test.  On the Night Before the Test: . Be sure to Drink plenty of water. . Do not consume any caffeinated/decaffeinated beverages or chocolate 12 hours prior to your test. . Do not take any antihistamines 12 hours prior to your test.  On the Day of the Test: . Drink plenty of water. Do not drink any water within one hour of the test. . Do not eat any food 4 hours prior to the test. . You may take your regular medications prior to the test.  . Take metoprolol (Lopressor) two hours prior to test. . HOLD Furosemide/Hydrochlorothiazide morning of the test.   After the Test: . Drink plenty of water. . After receiving IV  contrast, you may experience a mild flushed feeling. This is normal. . On occasion, you may experience a mild rash up to 24 hours after the test. This is not dangerous. If this occurs, you can take Benadryl 25 mg and increase your fluid intake. . If you experience trouble breathing, this can be serious. If it is severe call 911 IMMEDIATELY. If it is mild, please call our office. . If you take any of these medications: Glipizide/Metformin, Avandament, Glucavance, please do not take 48 hours after completing test unless otherwise instructed.   Once we have confirmed authorization from your insurance company, we will call you to set up a date and time for your test.   For non-scheduling related questions, please contact the cardiac imaging nurse navigator should you have any questions/concerns: Rockwell Alexandria, RN Navigator Cardiac Imaging [facility] [phone] office  For  scheduling needs, including cancellations and rescheduling, please call [phone].

## 2019-10-29 ENCOUNTER — Telehealth: Payer: Self-pay | Admitting: Radiology

## 2019-10-29 NOTE — Telephone Encounter (Signed)
Enrolled patient for a 14 day Zio monitor to be mailed to patients home.  

## 2019-11-09 ENCOUNTER — Other Ambulatory Visit: Payer: Self-pay

## 2019-11-09 ENCOUNTER — Ambulatory Visit (HOSPITAL_COMMUNITY): Payer: BC Managed Care – PPO | Attending: Internal Medicine

## 2019-11-09 DIAGNOSIS — R002 Palpitations: Secondary | ICD-10-CM | POA: Diagnosis present

## 2019-11-09 DIAGNOSIS — R0602 Shortness of breath: Secondary | ICD-10-CM | POA: Insufficient documentation

## 2019-11-15 ENCOUNTER — Other Ambulatory Visit (INDEPENDENT_AMBULATORY_CARE_PROVIDER_SITE_OTHER): Payer: BC Managed Care – PPO

## 2019-11-15 DIAGNOSIS — R002 Palpitations: Secondary | ICD-10-CM

## 2019-11-27 ENCOUNTER — Telehealth (HOSPITAL_COMMUNITY): Payer: Self-pay | Admitting: *Deleted

## 2019-11-27 NOTE — Telephone Encounter (Signed)
Attempted to call patient regarding upcoming cardiac CT appointment. Left message on voicemail with name and callback number  Anastasiya Gowin Tai RN Navigator Cardiac Imaging [facility] [phone] Office [phone] Cell 

## 2019-11-27 NOTE — Telephone Encounter (Signed)
Pt returning call concerning cardiac CT instructions. Instructions reviewed and Pt expressed understanding.

## 2019-11-30 ENCOUNTER — Other Ambulatory Visit: Payer: Self-pay

## 2019-11-30 ENCOUNTER — Ambulatory Visit (HOSPITAL_COMMUNITY)
Admission: RE | Admit: 2019-11-30 | Discharge: 2019-11-30 | Disposition: A | Discharge status: Home / Self Care | Payer: BC Managed Care – PPO | Source: Ambulatory Visit | Attending: Cardiology | Admitting: Cardiology

## 2019-11-30 DIAGNOSIS — R079 Chest pain, unspecified: Secondary | ICD-10-CM | POA: Insufficient documentation

## 2019-11-30 DIAGNOSIS — R0789 Other chest pain: Secondary | ICD-10-CM

## 2019-11-30 MED ORDER — IOHEXOL 350 MG/ML SOLN
80.0000 mL | Freq: Once | INTRAVENOUS | Status: AC | PRN
  Administered 2019-11-30: 80 mL via INTRAVENOUS

## 2019-11-30 MED ORDER — NITROGLYCERIN 0.4 MG SL SUBL
0.8000 mg | SUBLINGUAL_TABLET | Freq: Once | SUBLINGUAL | Status: AC
  Administered 2019-11-30: 0.8 mg via SUBLINGUAL

## 2019-11-30 MED ORDER — NITROGLYCERIN 0.4 MG SL SUBL
SUBLINGUAL_TABLET | SUBLINGUAL | Status: AC
  Filled 2019-11-30: qty 2

## 2019-12-15 ENCOUNTER — Encounter: Payer: Self-pay | Admitting: *Deleted

## 2019-12-24 ENCOUNTER — Encounter: Payer: Self-pay | Admitting: Cardiology

## 2019-12-24 ENCOUNTER — Ambulatory Visit (INDEPENDENT_AMBULATORY_CARE_PROVIDER_SITE_OTHER): Payer: BC Managed Care – PPO | Admitting: Cardiology

## 2019-12-24 ENCOUNTER — Other Ambulatory Visit: Payer: Self-pay

## 2019-12-24 VITALS — BP 120/80 | HR 64 | Ht 69.0 in | Wt 201.0 lb

## 2019-12-24 DIAGNOSIS — I10 Essential (primary) hypertension: Secondary | ICD-10-CM

## 2019-12-24 DIAGNOSIS — R002 Palpitations: Secondary | ICD-10-CM | POA: Diagnosis not present

## 2019-12-24 DIAGNOSIS — R0789 Other chest pain: Secondary | ICD-10-CM

## 2019-12-24 NOTE — Patient Instructions (Signed)

## 2019-12-24 NOTE — Progress Notes (Signed)
Cardiology Office Note:    Date:  12/24/2019   ID:  Ian Day, DOB 12-May-1976, MRN [id number]  PCP:  Ileana Ladd, MD  [facility] Cardiologist:  Donato Schultz, MD  [facility] Electrophysiologist:  None   Referring MD: Ileana Ladd, MD     History of Present Illness:    Ian Day is a 44 y.o. male with a hx of essential hypertension palpitations here for follow-up of coronary CT and event monitor.  See below for details.  Currently doing well.  Has not felt any palpitations in quite some time actually.  Past Medical History:  Diagnosis Date   Atypical chest pain 09/02/2014   Dyslipidemia    GERD (gastroesophageal reflux disease)    Heart murmur    Heart murmur    Hypertension    Hypertriglyceridemia 03/10/2014   Knee hyperextension injury    Low back pain    Low HDL (under 40)    Obesity, unspecified 03/10/2014   Overweight    Palpitations 09/02/2014   Proteinuria    Varicose veins of both lower extremities     No past surgical history on file.  Current Medications: Current Meds  Medication Sig   AMLODIPINE BESYLATE PO Take 5 mg by mouth.     Allergies:   Patient has no known allergies.   Social History   Socioeconomic History   Marital status: Single    Spouse name: Not on file   Number of children: Not on file   Years of education: Not on file   Highest education level: Not on file  Occupational History   Not on file  Tobacco Use   Smoking status: Never Smoker   Smokeless tobacco: Never Used  Vaping Use   Vaping Use: Never used  Substance and Sexual Activity   Alcohol use: No   Drug use: No   Sexual activity: Yes  Other Topics Concern   Not on file  Social History Narrative   Not on file   Social Determinants of Health   Financial Resource Strain:    Difficulty of Paying Living Expenses:   Food Insecurity:    Worried About Programme researcher, broadcasting/film/video in the Last Year:    Barista in  the Last Year:   Transportation Needs:    Freight forwarder (Medical):    Lack of Transportation (Non-Medical):   Physical Activity:    Days of Exercise per Week:    Minutes of Exercise per Session:   Stress:    Feeling of Stress :   Social Connections:    Frequency of Communication with Friends and Family:    Frequency of Social Gatherings with Friends and Family:    Attends Religious Services:    Active Member of Clubs or Organizations:    Attends Engineer, structural:    Marital Status:      Family History: The patient's family history includes Heart attack in his paternal grandfather and paternal grandmother; Hyperlipidemia in his mother; Hypertension in his father and mother.  ROS:   Please see the history of present illness.     All other systems reviewed and are negative.  EKGs/Labs/Other Studies Reviewed:    The following studies were reviewed today:  1. Left ventricular ejection fraction, by estimation, is 65 to 70%. The  left ventricle has normal function. The left ventricle has no regional  wall motion abnormalities. Left ventricular diastolic parameters were  normal.  2. Right ventricular  systolic function is normal. The right ventricular  size is normal.  3. The mitral valve is abnormal. Trivial mitral valve regurgitation.  4. The aortic valve is normal in structure. Aortic valve regurgitation is  not visualized.   Recent Labs: No results found for requested labs within last 8760 hours.  Recent Lipid Panel No results found for: CHOL, TRIG, HDL, CHOLHDL, VLDL, LDLCALC, LDLDIRECT  Physical Exam:    VS:  BP 120/80    Pulse 64    Ht 5\' 9"  (1.753 m)    Wt 201 lb (91.2 kg)    SpO2 98%    BMI 29.68 kg/m     Wt Readings from Last 3 Encounters:  12/24/19 201 lb (91.2 kg)  10/28/19 199 lb 12.8 oz (90.6 kg)  11/21/16 218 lb 3.2 oz (99 kg)     GEN:  Well nourished, well developed in no acute distress HEENT: Normal NECK: No JVD; No  carotid bruits LYMPHATICS: No lymphadenopathy CARDIAC: RRR, no murmurs, rubs, gallops RESPIRATORY:  Clear to auscultation without rales, wheezing or rhonchi  ABDOMEN: Soft, non-tender, non-distended MUSCULOSKELETAL:  No edema; No deformity  SKIN: Warm and dry NEUROLOGIC:  Alert and oriented x 3 PSYCHIATRIC:  Normal affect   ASSESSMENT:    1. Palpitations   2. Atypical chest pain   3. Essential hypertension    PLAN:    In order of problems listed above:  Palpitations -PACs/PVCs noted on monitor.  No adverse arrhythmias.   Average heart rate - 68 bpm, sinus rhythm  Rare PVC's - likely representative of his symptoms  No afib, no pauses  Brief paroxysmal atrial tachycardia, 5 beats. Benign.  Overall reassuring monitor.   Atypical chest pain  -IMPRESSION: 1. Coronary calcium score of 0. This was 0 percentile for age and sex matched control.  2. Normal coronary origin with right dominance.  3. No evidence of CAD.  4. CAD-RADS 0. No evidence of CAD (0%). Consider non-atherosclerotic causes of chest pain.  5.  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  Essential hypertension -Amlodipine.  Doing well.  120/80 today.   Medication Adjustments/Labs and Tests Ordered: Current medicines are reviewed at length with the patient today.  Concerns regarding medicines are outlined above.  No orders of the defined types were placed in this encounter.  No orders of the defined types were placed in this encounter.   Patient Instructions  Medication Instructions:  The current medical regimen is effective;  continue present plan and medications.  *If you need a refill on your cardiac medications before your next appointment, please call your pharmacy*  Follow-Up: At [facility], you and your health needs are our priority.  As part of our continuing mission to provide you with exceptional heart care, we have created designated Provider Care Teams.   These Care Teams include your primary Cardiologist (physician) and Advanced Practice Providers (APPs -  Physician Assistants and Nurse Practitioners) who all work together to provide you with the care you need, when you need it.  We recommend signing up for the patient portal called "MyChart".  Sign up information is provided on this After Visit Summary.  MyChart is used to connect with patients for Virtual Visits (Telemedicine).  Patients are able to view lab/test results, encounter notes, upcoming appointments, etc.  Non-urgent messages can be sent to your provider as well.   To learn more about what you can do with MyChart, go to [email or web address].    Your next appointment:  12 month(s)  The format for your next appointment:   In Person  Provider:   Donato Schultz, MD        Signed, Donato Schultz, MD  12/24/2019 5:10 PM    [facility]

## 2020-07-28 ENCOUNTER — Telehealth: Payer: Self-pay | Admitting: Cardiology

## 2020-07-28 ENCOUNTER — Emergency Department (HOSPITAL_BASED_OUTPATIENT_CLINIC_OR_DEPARTMENT_OTHER)
Admission: EM | Admit: 2020-07-28 | Discharge: 2020-07-28 | Disposition: A | Discharge status: Home / Self Care | Payer: BC Managed Care – PPO | Attending: Emergency Medicine | Admitting: Emergency Medicine

## 2020-07-28 ENCOUNTER — Other Ambulatory Visit: Payer: Self-pay

## 2020-07-28 ENCOUNTER — Emergency Department (HOSPITAL_BASED_OUTPATIENT_CLINIC_OR_DEPARTMENT_OTHER): Payer: BC Managed Care – PPO

## 2020-07-28 DIAGNOSIS — R079 Chest pain, unspecified: Secondary | ICD-10-CM | POA: Diagnosis not present

## 2020-07-28 DIAGNOSIS — Z79899 Other long term (current) drug therapy: Secondary | ICD-10-CM | POA: Diagnosis not present

## 2020-07-28 DIAGNOSIS — R002 Palpitations: Secondary | ICD-10-CM | POA: Diagnosis not present

## 2020-07-28 DIAGNOSIS — R1013 Epigastric pain: Secondary | ICD-10-CM | POA: Diagnosis present

## 2020-07-28 DIAGNOSIS — I10 Essential (primary) hypertension: Secondary | ICD-10-CM | POA: Insufficient documentation

## 2020-07-28 LAB — LIPASE, BLOOD: Lipase: 30 U/L (ref 11–51)

## 2020-07-28 LAB — CBC
HCT: 53.2 % — ABNORMAL HIGH (ref 39.0–52.0)
Hemoglobin: 18.1 g/dL — ABNORMAL HIGH (ref 13.0–17.0)
MCH: 28.2 pg (ref 26.0–34.0)
MCHC: 34 g/dL (ref 30.0–36.0)
MCV: 82.9 fL (ref 80.0–100.0)
Platelets: 349 10*3/uL (ref 150–400)
RBC: 6.42 MIL/uL — ABNORMAL HIGH (ref 4.22–5.81)
RDW: 13 % (ref 11.5–15.5)
WBC: 8.1 10*3/uL (ref 4.0–10.5)
nRBC: 0 % (ref 0.0–0.2)

## 2020-07-28 LAB — COMPREHENSIVE METABOLIC PANEL
ALT: 25 U/L (ref 0–44)
AST: 22 U/L (ref 15–41)
Albumin: 5.2 g/dL — ABNORMAL HIGH (ref 3.5–5.0)
Alkaline Phosphatase: 75 U/L (ref 38–126)
Anion gap: 14 (ref 5–15)
BUN: 17 mg/dL (ref 6–20)
CO2: 28 mmol/L (ref 22–32)
Calcium: 9.6 mg/dL (ref 8.9–10.3)
Chloride: 95 mmol/L — ABNORMAL LOW (ref 98–111)
Creatinine, Ser: 1.3 mg/dL — ABNORMAL HIGH (ref 0.61–1.24)
GFR, Estimated: >60 mL/min (ref >60)
Glucose, Bld: 100 mg/dL — ABNORMAL HIGH (ref 70–99)
Potassium: 3.8 mmol/L (ref 3.5–5.1)
Sodium: 137 mmol/L (ref 135–145)
Total Bilirubin: 1.1 mg/dL (ref 0.3–1.2)
Total Protein: 9.2 g/dL — ABNORMAL HIGH (ref 6.5–8.1)

## 2020-07-28 LAB — TROPONIN I (HIGH SENSITIVITY): Troponin I (High Sensitivity): 3 ng/L (ref <18)

## 2020-07-28 NOTE — Telephone Encounter (Signed)
Ian Day is calling stating he has been having unusual pains (he states it is not chest pain), but he is not sure if it is worth a trip to the hospital possibly an appointment at the office. He also states he is having unusually noticeable indigestion especially 30 minutes after eating. He states his HR also jumps up after he eats for some reason and he is still having a rapid HR at times. He is requesting a call from a nurse to see what they advise, but is leaving for an appointment now and would like a callback after 12:00 pm. Please advise.

## 2020-07-28 NOTE — Telephone Encounter (Signed)
Agree with advice. May be GERD related issue. Shoulder/chest pain/shortness of breath are not heart related given excellent CT scan with no CAD.  Heart rate changes can occur with eating, sometimes a vagal response. Should be of no clinical concern.  Thanks  Donato Schultz, MD

## 2020-07-28 NOTE — Telephone Encounter (Signed)
Patient stated since Sunday, every time he eats his heart rate goes up and he has palpitations. Patient not sure if this is indigestion, but he wanted to let Dr. Anne Fu know. Patient is also complaining about a dull pain between his shoulder blades on his back, sometimes SOB with activity, and sharp headaches that come and go. Informed patient that since his CT showed a calcium score of 0 in May that this might be a GI issue. Encouraged patient to call his PCP about his symptoms. Will also send message to Dr. Anne Fu for further advisement.

## 2020-07-28 NOTE — Telephone Encounter (Signed)
Left message for patient to call back  

## 2020-07-28 NOTE — ED Triage Notes (Signed)
Pt reports having abd pain since Sunday with indigestion.Pt states that his HR stays elevated a short time after eating then dissipates with time. Mid back pain, headaches. Pt was sent by his pcp r/o gallbladder. Pt has a call out to his cardiologist without response.

## 2020-07-28 NOTE — ED Notes (Signed)
C/o indigestion type feeling and increase of heart palpitations for the past few days, states he does not take any meds for HR or cardiac issues, except Norvasc for HTN with HCTZ.

## 2020-07-28 NOTE — ED Provider Notes (Signed)
[facility] EMERGENCY DEPARTMENT Provider Note   CSN: [id number] Arrival date & time: 07/28/20  1514     History Chief Complaint  Patient presents with  . Abdominal Pain    Ian Day is a 45 y.o. male.  Patient reports epigastric pain described as indigestion, intermittent palpitations with heart rate ranging from 70-90.  He has a history of hypertension.  But no other risk factors for acute coronary disease.  No exertional change.  Symptoms come on when he eats.  Denies fevers chills trauma.  Is able tolerate p.o. hydration and nutrition.  Denies shortness of breath.  Denies lightheadedness or syncope.   Abdominal Pain Associated symptoms: chest pain   Associated symptoms: no chills, no cough, no diarrhea, no dysuria, no fever, no nausea, no shortness of breath and no vomiting        Past Medical History:  Diagnosis Date  . Atypical chest pain 09/02/2014  . Dyslipidemia   . GERD (gastroesophageal reflux disease)   . Heart murmur   . Heart murmur   . Hypertension   . Hypertriglyceridemia 03/10/2014  . Knee hyperextension injury   . Low back pain   . Low HDL (under 40)   . Obesity, unspecified 03/10/2014  . Overweight   . Palpitations 09/02/2014  . Proteinuria   . Varicose veins of both lower extremities     Patient Active Problem List   Diagnosis Date Noted  . Obesity 09/02/2014  . Palpitations 09/02/2014  . Atypical chest pain 09/02/2014  . Palpitation 03/10/2014  . Hypertriglyceridemia 03/10/2014  . Obesity, unspecified 03/10/2014    No past surgical history on file.     Family History  Problem Relation Age of Onset  . Hypertension Mother   . Hyperlipidemia Mother   . Hypertension Father   . Heart attack Paternal Grandmother   . Heart attack Paternal Grandfather     Social History   Tobacco Use  . Smoking status: Never Smoker  . Smokeless tobacco: Never Used  Vaping Use  . Vaping Use: Never used  Substance Use Topics  .  Alcohol use: No  . Drug use: No    Home Medications Prior to Admission medications   Medication Sig Start Date End Date Taking? Authorizing Provider  AMLODIPINE BESYLATE PO Take 5 mg by mouth.    [provider]    Allergies    Patient has no known allergies.  Review of Systems   Review of Systems  Constitutional: Negative for chills and fever.  HENT: Negative for congestion and rhinorrhea.   Respiratory: Negative for cough and shortness of breath.   Cardiovascular: Positive for chest pain. Negative for palpitations.  Gastrointestinal: Positive for abdominal pain. Negative for diarrhea, nausea and vomiting.  Genitourinary: Negative for difficulty urinating and dysuria.  Musculoskeletal: Negative for arthralgias and back pain.  Skin: Negative for color change and rash.  Neurological: Negative for light-headedness and headaches.    Physical Exam Updated Vital Signs BP (!) 127/100   Pulse 74   Temp 97.9 F (36.6 C) (Oral)   Resp 13   Ht 5\' 9"  (1.753 m)   Wt 91.2 kg   SpO2 100%   BMI 29.69 kg/m   Physical Exam Vitals and nursing note reviewed.  Constitutional:      General: He is not in acute distress.    Appearance: Normal appearance.  HENT:     Head: Normocephalic and atraumatic.     Nose: No rhinorrhea.  Eyes:  General:        Right eye: No discharge.        Left eye: No discharge.     Conjunctiva/sclera: Conjunctivae normal.  Cardiovascular:     Rate and Rhythm: Normal rate and regular rhythm.  Pulmonary:     Effort: Pulmonary effort is normal.     Breath sounds: No stridor.  Abdominal:     General: Abdomen is flat. There is no distension.     Palpations: Abdomen is soft.     Tenderness: There is no abdominal tenderness. There is no right CVA tenderness, left CVA tenderness, guarding or rebound. Negative signs include Murphy's sign and Rovsing's sign.  Musculoskeletal:        General: No deformity or signs of injury.  Skin:    General: Skin  is warm and dry.  Neurological:     General: No focal deficit present.     Mental Status: He is alert. Mental status is at baseline.     Motor: No weakness.  Psychiatric:        Mood and Affect: Mood normal.        Behavior: Behavior normal.        Thought Content: Thought content normal.     ED Results / Procedures / Treatments   Labs (all labs ordered are listed, but only abnormal results are displayed) Labs Reviewed  COMPREHENSIVE METABOLIC PANEL - Abnormal; Notable for the following components:      Result Value   Chloride 95 (*)    Glucose, Bld 100 (*)    Creatinine, Ser 1.30 (*)    Total Protein 9.2 (*)    Albumin 5.2 (*)    All other components within normal limits  CBC - Abnormal; Notable for the following components:   RBC 6.42 (*)    Hemoglobin 18.1 (*)    HCT 53.2 (*)    All other components within normal limits  LIPASE, BLOOD  URINALYSIS, ROUTINE W REFLEX MICROSCOPIC  TROPONIN I (HIGH SENSITIVITY)  TROPONIN I (HIGH SENSITIVITY)    EKG EKG Interpretation  Date/Time:  Thursday July 28 2020 15:43:18 EST Ventricular Rate:  78 PR Interval:  152 QRS Duration: 94 QT Interval:  380 QTC Calculation: 433 R Axis:   85 Text Interpretation: Normal sinus rhythm Normal ECG Confirmed by Cherlynn Perches ([id number]) on 07/28/2020 8:32:04 PM   Radiology DG Chest 2 View  Result Date: 07/28/2020 CLINICAL DATA:  Chest pain EXAM: CHEST - 2 VIEW COMPARISON:  08/09/2015 FINDINGS: The heart size and mediastinal contours are within normal limits. Both lungs are clear. The visualized skeletal structures are unremarkable. IMPRESSION: No active cardiopulmonary disease. Electronically Signed   By: Deatra Robinson M.D.   On: 07/28/2020 21:12    Procedures Procedures (including critical care time)  Medications Ordered in ED Medications - No data to display  ED Course  I have reviewed the triage vital signs and the nursing notes.  Pertinent labs & imaging results that were available  during my care of the patient were reviewed by me and considered in my medical decision making (see chart for details).    MDM Rules/Calculators/A&P                          Patient with symptoms with meals, low risk chest pain, EKG without any acute ischemic change interval abnormality or arrhythmia, sinus rhythm.  Troponin negative.  Screening laboratory studies for abdominal pathology are unremarkable.  Chest x-ray  per my review and radiology reveals no acute cardiopulmonary pathology.  Patient safe for outpatient work-up of epigastric discomfort with meals.  Given return precautions.  No signs of peritonitis on exam.  Final Clinical Impression(s) / ED Diagnoses Final diagnoses:  Chest pain, unspecified type  Palpitations    Rx / DC Orders ED Discharge Orders    None       Sabino Donovan, MD 07/28/20 2147

## 2020-07-29 NOTE — Telephone Encounter (Signed)
Patient made aware of the recommendations from Dr. Anne Fu, see below. Patient aware to call back with any new concerns.     Agree with advice. May be GERD related issue. Shoulder/chest pain/shortness of breath are not heart related given excellent CT scan with no CAD.  Heart rate changes can occur with eating, sometimes a vagal response. Should be of no clinical concern.  Thanks  Donato Schultz, MD

## 2020-08-04 IMAGING — CT CT HEART MORP W/ CTA COR W/ SCORE W/ CA W/CM &/OR W/O CM
4 of 7 series · 8 of 20 positions shown, 9 images · IV contrast (APPLIED)
Comparison: None.
COMPARISON: None.

Addendum:
EXAM:
OVER-READ INTERPRETATION  CT CHEST

The following report is an over-read performed by radiologist Dr.
Yesbol Butenko [REDACTED] on 11/30/2019. This over-read
does not include interpretation of cardiac or coronary anatomy or
pathology. The coronary CTA interpretation by the cardiologist is
attached.
CLINICAL DATA: 43 year old with atypical chest pain
Cardiac/Coronary  CTA
TECHNIQUE: The patient was scanned on a [employer] Force scanner.

[Series 6: best diast 73 % · axial · 0.39mm/px · z∈[-205,-159]mm · 2 of 346 slices shown]
[im 116/346  vessel]
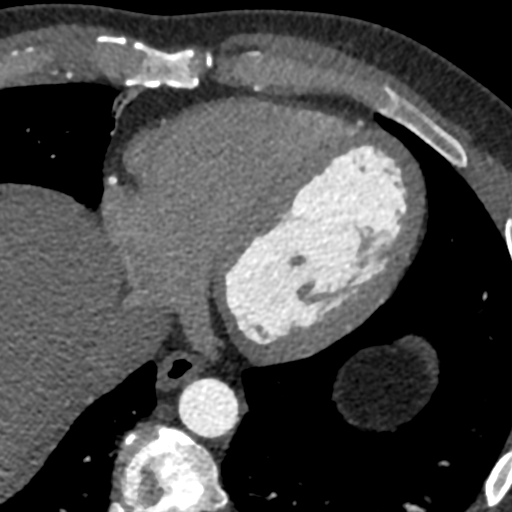
[im 231/346  vessel]
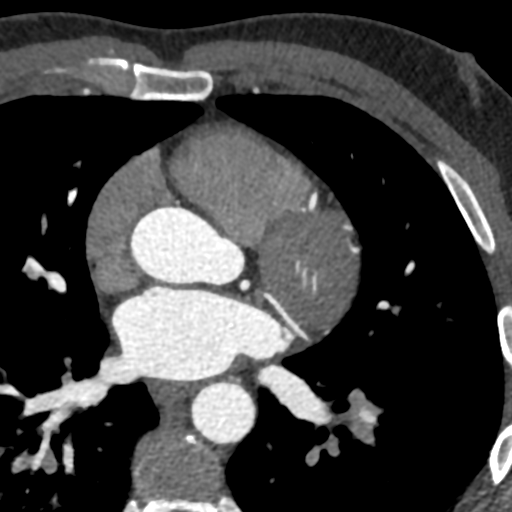

[Series 7: best syst · axial · 0.39mm/px · z∈[-205,-159]mm · 2 of 346 slices shown, 3 images]
[im 116/346  vessel]
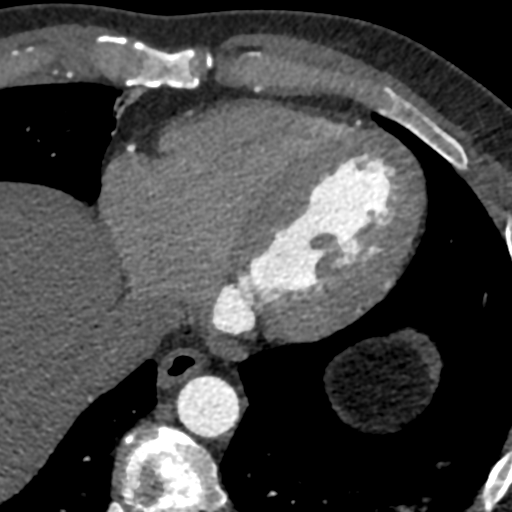
[im 116/346  lung]
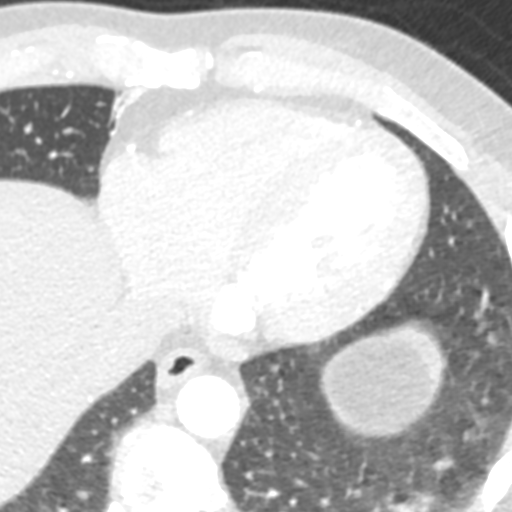
[im 231/346  vessel]
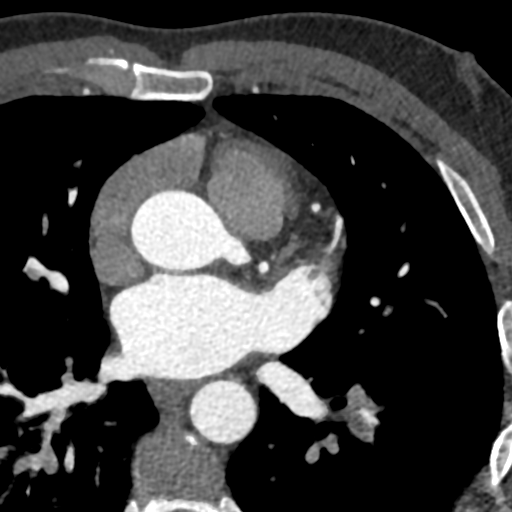

[Series 8: ts diast sharp 73 % · axial · 0.39mm/px · z∈[-205,-159]mm · 2 of 346 slices shown]
[im 116/346  lung]
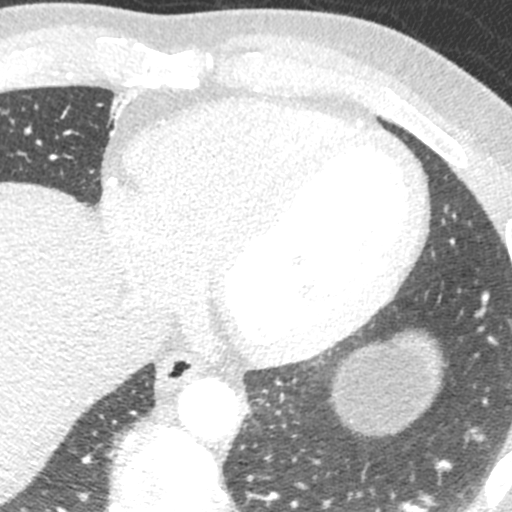
[im 231/346  lung]
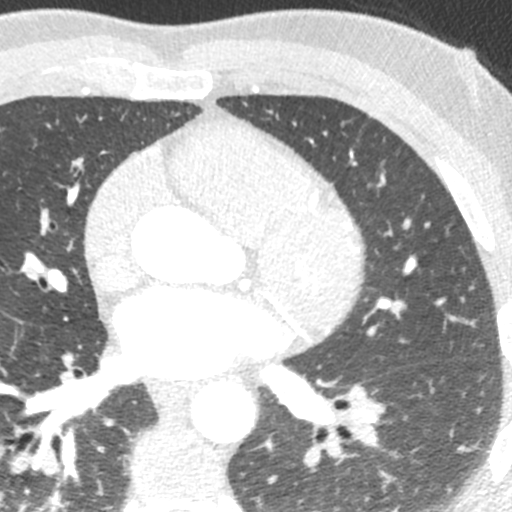

[Series 9: ts syst sharp · axial · 0.39mm/px · z∈[-205,-159]mm · 2 of 346 slices shown]
[im 116/346  lung]
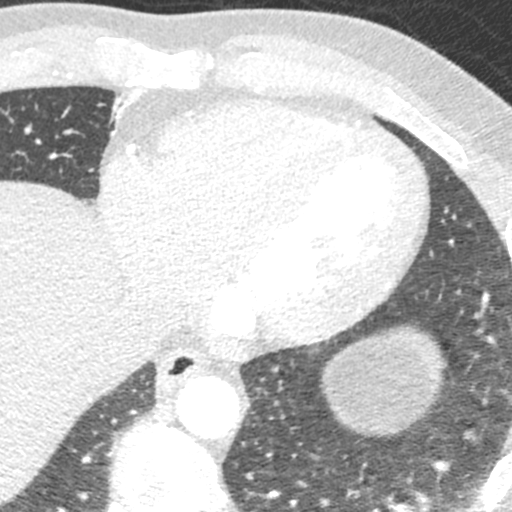
[im 231/346  lung]
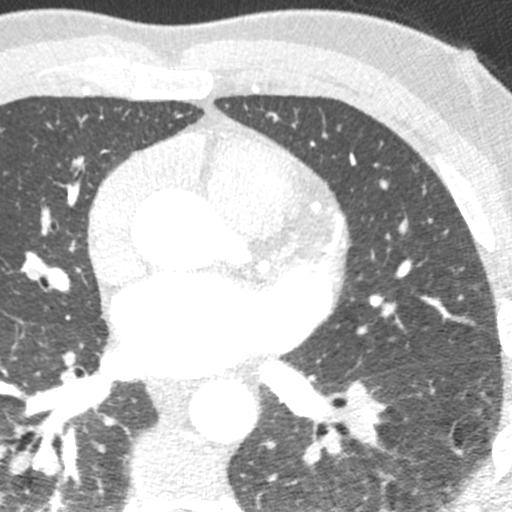

[8 of 20 positions shown; findings below may reference images not displayed]

FINDINGS: Vascular: Heart is normal size.  Aorta normal caliber.

Mediastinum/Nodes: No adenopathy

Lungs/Pleura: No confluent opacities or effusions.

Upper Abdomen: Imaging into the upper abdomen shows no acute
findings.

Musculoskeletal: Chest wall soft tissues are unremarkable. No acute
bony abnormality.
IMPRESSION: No acute or significant extracardiac abnormality.
FINDINGS: A 100 kV prospective scan was triggered in the descending thoracic
aorta at 111 HU's. Axial non-contrast 3 mm slices were carried out
through the heart. The data set was analyzed on a dedicated work
station and scored using the Agatson method. Gantry rotation speed
was 250 msecs and collimation was .6 mm. Beta blockade and 0.8 mg of
sl NTG was given. The 3D data set was reconstructed in 5% intervals
of the 67-82 % of the R-R cycle. Diastolic phases were analyzed on a
dedicated work station using MPR, MIP and VRT modes. The patient
received 80 cc of contrast.

Aorta:  Normal size.  Mild arch calcification.  No dissection.

Aortic Valve:  Trileaflet.  No calcifications.

Coronary Arteries:  Normal coronary origin.  Right dominance.

RCA is a large dominant artery that gives rise to PDA and PLA. There
is no plaque.

Left main is a large artery that gives rise to LAD and LCX arteries.

LAD is a large vessel that has no plaque. One large branching
diagonal.

LCX is a non-dominant artery that gives rise to two large OM
branches and third small caliber OM branch distally. There is no
plaque.

Other findings:

Normal pulmonary vein drainage into the left atrium.

Normal left atrial appendage without a thrombus.

Normal size of the pulmonary artery.

Please see radiology report for non cardiac findings.
IMPRESSION: 1. Coronary calcium score of 0. This was 0 percentile for age and
sex matched control.

2. Normal coronary origin with right dominance.

3. No evidence of CAD.

4. CAD-RADS 0. No evidence of CAD (0%). Consider non-atherosclerotic
causes of chest pain.

5.  Mild aortic arch atherosclerosis seen on calcium score series.

*** End of Addendum ***
EXAM:
OVER-READ INTERPRETATION  CT CHEST

The following report is an over-read performed by radiologist Dr.
Yesbol Butenko [REDACTED] on 11/30/2019. This over-read
does not include interpretation of cardiac or coronary anatomy or
pathology. The coronary CTA interpretation by the cardiologist is
attached.
FINDINGS: Vascular: Heart is normal size.  Aorta normal caliber.

Mediastinum/Nodes: No adenopathy

Lungs/Pleura: No confluent opacities or effusions.

Upper Abdomen: Imaging into the upper abdomen shows no acute
findings.

Musculoskeletal: Chest wall soft tissues are unremarkable. No acute
bony abnormality.
IMPRESSION: No acute or significant extracardiac abnormality.

## 2021-04-02 IMAGING — DX DG CHEST 2V
2 series · 2 of 2 positions shown · non-contrast
Comparison: 08/09/2015

CLINICAL DATA: Chest pain

EXAM:
CHEST - 2 VIEW

[chest pa]
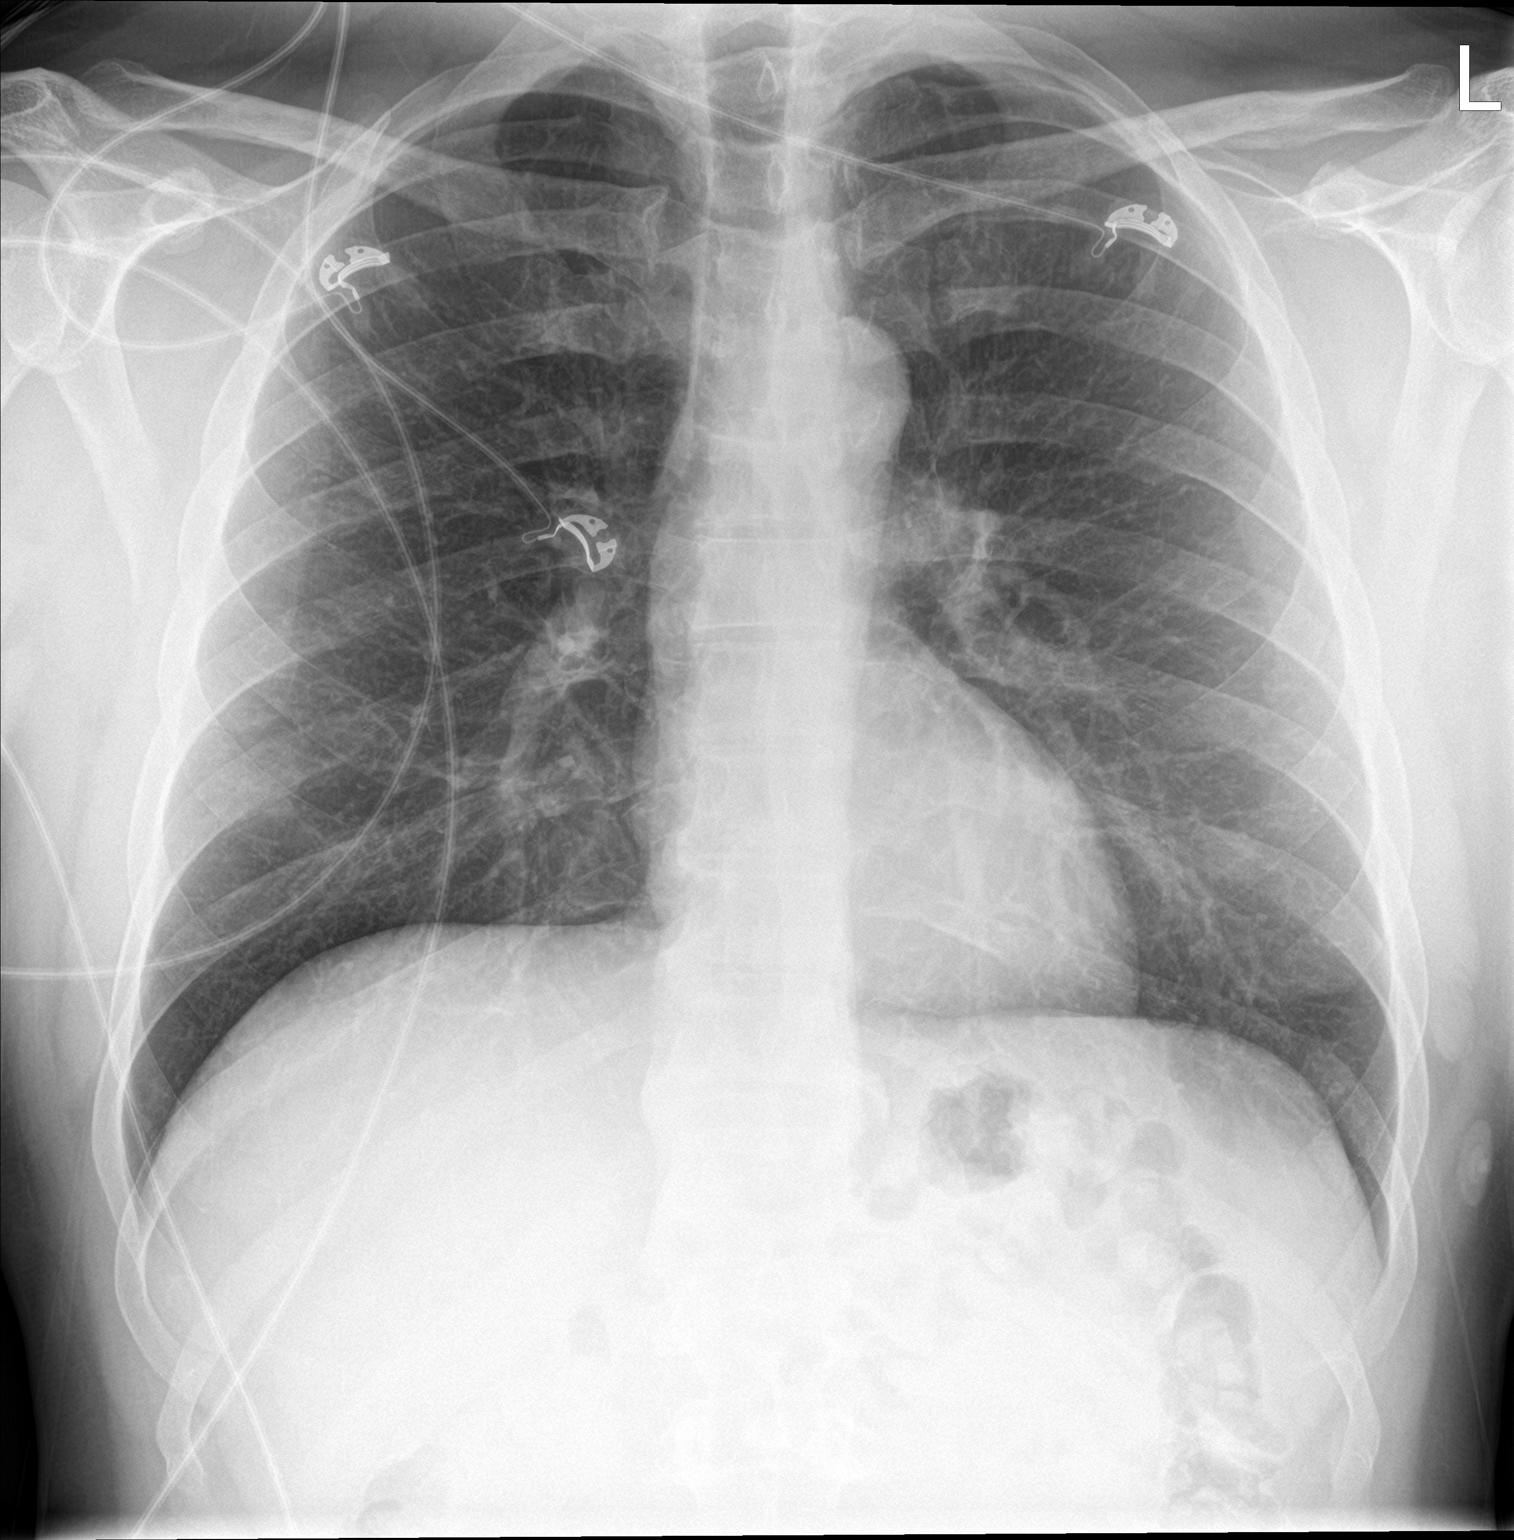

[chest lat]
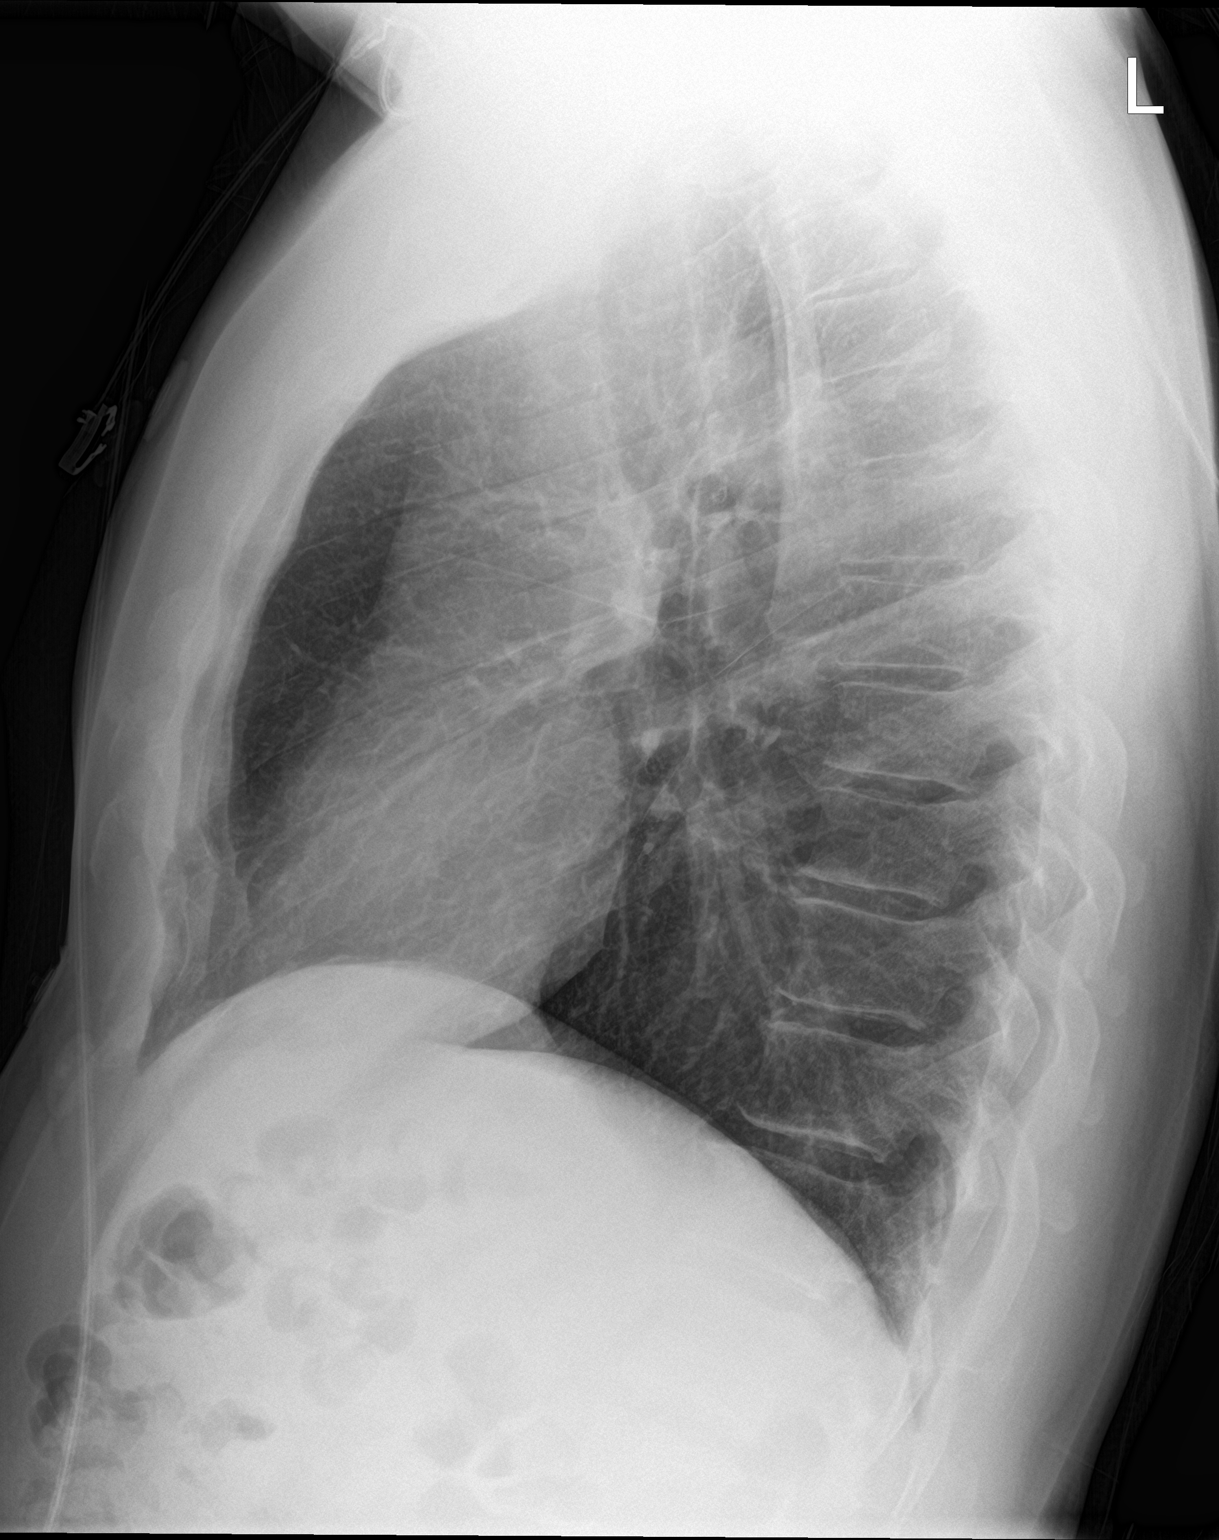

[2 of 2 positions shown; findings below may reference images not displayed]

FINDINGS: The heart size and mediastinal contours are within normal limits.
Both lungs are clear. The visualized skeletal structures are
unremarkable.
IMPRESSION: No active cardiopulmonary disease.

## 2021-10-09 DIAGNOSIS — E785 Hyperlipidemia, unspecified: Secondary | ICD-10-CM | POA: Diagnosis not present

## 2021-10-09 DIAGNOSIS — I10 Essential (primary) hypertension: Secondary | ICD-10-CM | POA: Diagnosis not present

## 2021-10-09 DIAGNOSIS — Z0000 Encounter for general adult medical examination without abnormal findings: Secondary | ICD-10-CM | POA: Diagnosis not present

## 2022-04-05 DIAGNOSIS — Z08 Encounter for follow-up examination after completed treatment for malignant neoplasm: Secondary | ICD-10-CM | POA: Diagnosis not present

## 2022-04-05 DIAGNOSIS — L570 Actinic keratosis: Secondary | ICD-10-CM | POA: Diagnosis not present

## 2022-04-05 DIAGNOSIS — L821 Other seborrheic keratosis: Secondary | ICD-10-CM | POA: Diagnosis not present

## 2022-04-05 DIAGNOSIS — L814 Other melanin hyperpigmentation: Secondary | ICD-10-CM | POA: Diagnosis not present

## 2022-04-05 DIAGNOSIS — D1801 Hemangioma of skin and subcutaneous tissue: Secondary | ICD-10-CM | POA: Diagnosis not present

## 2022-07-17 DIAGNOSIS — Z23 Encounter for immunization: Secondary | ICD-10-CM | POA: Diagnosis not present

## 2022-09-18 ENCOUNTER — Telehealth: Payer: Self-pay | Admitting: Cardiology

## 2022-09-18 NOTE — Telephone Encounter (Signed)
Patient states in the lower right leg around his calf feels a sensation similar to a cramping or pulled muscle, but has not done any activity to trigger this feeling. This has been going on for the past few days and he would like to know what Dr. Marlou Porch recommends, and if he needs to be worked in with Dr. Marlou Porch or his PCP.

## 2022-09-18 NOTE — Telephone Encounter (Signed)
Spoke with pt at length regarding his current symptom of right leg pain/swelling.  He reports having had 2 or 2 bulging veins in his leg for awhile now (yrs) but he feels as though he is having unusual swelling/pain in the area now.  He reports it feels like a pulled muscle or cramp but "different" than in the past.  He has had bruising there in the past but denies any at this time.  He denies any redness/streaking but reports there is some warmness to the area.  He has been wearing a compression sleeves which he reports does seem to help some.  He denies any further s/s at this time.  No recent flights/travel but does sit a lot at work.  I discuss symptoms further, pt does report this past weekend, he fell off a stool and it in in the fall.  He doesn't seem to believe his s/s came from this encounter.  Discussed ED precautions with pt who states understanding.  He is aware if his s/s change or he develops any SOB he should report to ED asap for further evaluation.  He has been scheduled to see Dr Gillian Shields 09/19/22 for further evaluation.  He will call back prior to then if any further questions or concerns.

## 2022-09-18 NOTE — Telephone Encounter (Signed)
Patient is returning call.  °

## 2022-09-18 NOTE — Telephone Encounter (Signed)
Attempted to contact pt to discuss his concerns.  Person who answered the phone stated "he is away from his desk".  Left message MD office returning his call and to c/b at his convenience.

## 2022-09-19 ENCOUNTER — Encounter: Payer: Self-pay | Admitting: Cardiology

## 2022-09-19 ENCOUNTER — Ambulatory Visit: Payer: BC Managed Care – PPO | Attending: Cardiology | Admitting: Cardiology

## 2022-09-19 VITALS — BP 122/78 | HR 77 | Ht 69.0 in | Wt 206.6 lb

## 2022-09-19 DIAGNOSIS — I8390 Asymptomatic varicose veins of unspecified lower extremity: Secondary | ICD-10-CM

## 2022-09-19 DIAGNOSIS — R600 Localized edema: Secondary | ICD-10-CM

## 2022-09-19 DIAGNOSIS — R609 Edema, unspecified: Secondary | ICD-10-CM

## 2022-09-19 DIAGNOSIS — R002 Palpitations: Secondary | ICD-10-CM

## 2022-09-19 NOTE — Patient Instructions (Signed)
Medication Instructions:   Your physician recommends that you continue on your current medications as directed. Please refer to the Current Medication list given to you today.  *If you need a refill on your cardiac medications before your next appointment, please call your pharmacy*   Lab Work:  None ordered.  If you have labs (blood work) drawn today and your tests are completely normal, you will receive your results only by: [place] (if you have MyChart) OR A paper copy in the mail If you have any lab test that is abnormal or we need to change your treatment, we will call you to review the results.   Testing/Procedures:  Your physician has requested that you have a lower or upper extremity venous duplex. This test is an ultrasound of the veins in the legs or arms. It looks at venous blood flow that carries blood from the heart to the legs or arms. Allow one hour for a Lower Venous exam. Allow thirty minutes for an Upper Venous exam. There are no restrictions or special instructions.   Follow-Up: At [facility], you and your health needs are our priority.  As part of our continuing mission to provide you with exceptional heart care, we have created designated Provider Care Teams.  These Care Teams include your primary Cardiologist (physician) and Advanced Practice Providers (APPs -  Physician Assistants and Nurse Practitioners) who all work together to provide you with the care you need, when you need it.  We recommend signing up for the patient portal called "MyChart".  Sign up information is provided on this After Visit Summary.  MyChart is used to connect with patients for Virtual Visits (Telemedicine).  Patients are able to view lab/test results, encounter notes, upcoming appointments, etc.  Non-urgent messages can be sent to your provider as well.   To learn more about what you can do with MyChart, go to [email or web address].    Your next appointment:   1  year(s)  Provider:   Candee Furbish, MD     Other Instructions  Your physician wants you to follow-up in: 1 year with Dr. Marlou Porch. You will receive a reminder letter in the mail two months in advance. If you don't receive a letter, please call our office to schedule the follow-up appointment.  You have been referred to [facility].  You should be hearing from the office in the next several weeks to schedule your appointment.     For your  leg edema you  should do  the following 1. Leg elevation - I recommend the Lounge Dr. Leg rest.  See below for details  2. Salt restriction  -  Use potassium chloride instead of regular salt as a salt substitute. 3. Walk regularly 4. Compression hose - [facility] [employer] store  5. Weight loss    Available on [email or web address] Or  Go to [email or web address]

## 2022-09-19 NOTE — Progress Notes (Signed)
Cardiology Office Note:    Date:  09/19/2022   ID:  Ian Day, DOB February 11, 1976, MRN [id number]  PCP:  Vernie Shanks, MD (Inactive)  [facility] Cardiologist:  Candee Furbish, MD  [facility] Electrophysiologist:  None   Referring MD: No ref. provider found     History of Present Illness:    Ian Day is a 47 y.o. male with a hx of essential hypertension palpitations here for evaluation of leg swelling. Prior CT heart reassuring. ECHO reassuring.   Lower right leg. Last summer, walking, felt like a strain calf, sharp pain. Now same thing. Swelling and pain in ca;f. 1 week gone. Swelling would come and go. Not major. Red streaks as well. Now just came on out of no where.   Right > Left lower extremity edema. Off of amlodipiine 5. Varicose back of leg. Ecchymosis inner thigh.   Past weekend fell off stool.   See below for details.  Has not felt any palpitations in quite some time actually.  Past Medical History:  Diagnosis Date   Atypical chest pain 09/02/2014   Dyslipidemia    GERD (gastroesophageal reflux disease)    Heart murmur    Heart murmur    Hypertension    Hypertriglyceridemia 03/10/2014   Knee hyperextension injury    Low back pain    Low HDL (under 40)    Obesity, unspecified 03/10/2014   Overweight    Palpitations 09/02/2014   Proteinuria    Varicose veins of both lower extremities     No past surgical history on file.  Current Medications: No outpatient medications have been marked as taking for the 09/19/22 encounter (Office Visit) with Jerline Pain, MD.     Allergies:   Patient has no known allergies.   Social History   Socioeconomic History   Marital status: Married    Spouse name: Not on file   Number of children: Not on file   Years of education: Not on file   Highest education level: Not on file  Occupational History   Not on file  Tobacco Use   Smoking status: Never   Smokeless tobacco: Never  Vaping Use   Vaping Use:  Never used  Substance and Sexual Activity   Alcohol use: No   Drug use: No   Sexual activity: Yes  Other Topics Concern   Not on file  Social History Narrative   Not on file   Social Determinants of Health   Financial Resource Strain: Not on file  Food Insecurity: Not on file  Transportation Needs: Not on file  Physical Activity: Not on file  Stress: Not on file  Social Connections: Not on file     Family History: The patient's family history includes Heart attack in his paternal grandfather and paternal grandmother; Hyperlipidemia in his mother; Hypertension in his father and mother.  ROS:   Please see the history of present illness.     All other systems reviewed and are negative.  EKGs/Labs/Other Studies Reviewed:    The following studies were reviewed today:  EKG: 09/19/2022-normal sinus rhythm 71 no other abnormalities.  Cardiac Studies & Procedures       ECHOCARDIOGRAM  ECHOCARDIOGRAM COMPLETE 11/09/2019  Narrative ECHOCARDIOGRAM REPORT    Patient Name:   Ian Day Date of Exam: 11/09/2019 Medical Rec #:  [id number]        Height:       69.0 in Accession #:    [id number]  Weight:       199.8 lb Date of Birth:  [birth year]       BSA:          2.065 m Patient Age:    49 years         BP:           138/90 mmHg Patient Gender: M                HR:           63 bpm. Exam Location:  [facility]  Procedure: 2D Echo, Cardiac Doppler and Color Doppler  Indications:    R06.00 Dyspnea R00.2 Palpitations  History:        Patient has prior history of Echocardiogram examinations, most recent 09/17/2012. Signs/Symptoms:Shortness of Breath, Chest Pain, Dyspnea and Murmur; Risk Factors:Family History of Coronary Artery Disease, Hypertension and Dyslipidemia. Palpitations.  Sonographer:    Deliah Boston RDCS Referring Phys: [place]   1. Left ventricular ejection fraction, by estimation, is 65 to 70%. The left ventricle has  normal function. The left ventricle has no regional wall motion abnormalities. Left ventricular diastolic parameters were normal. 2. Right ventricular systolic function is normal. The right ventricular size is normal. 3. The mitral valve is abnormal. Trivial mitral valve regurgitation. 4. The aortic valve is normal in structure. Aortic valve regurgitation is not visualized.  FINDINGS Left Ventricle: Left ventricular ejection fraction, by estimation, is 65 to 70%. The left ventricle has normal function. The left ventricle has no regional wall motion abnormalities. The left ventricular internal cavity size was normal in size. There is no left ventricular hypertrophy. Left ventricular diastolic parameters were normal.  Right Ventricle: The right ventricular size is normal. No increase in right ventricular wall thickness. Right ventricular systolic function is normal.  Left Atrium: Left atrial size was normal in size.  Right Atrium: Right atrial size was normal in size.  Pericardium: There is no evidence of pericardial effusion.  Mitral Valve: The mitral valve is abnormal. There is mild thickening of the mitral valve leaflet(s). Trivial mitral valve regurgitation.  Tricuspid Valve: The tricuspid valve is normal in structure. Tricuspid valve regurgitation is trivial.  Aortic Valve: The aortic valve is normal in structure. Aortic valve regurgitation is not visualized.  Pulmonic Valve: The pulmonic valve was normal in structure. Pulmonic valve regurgitation is not visualized.  Aorta: The aortic root is normal in size and structure.  IAS/Shunts: No atrial level shunt detected by color flow Doppler.   LEFT VENTRICLE PLAX 2D LVIDd:         4.53 cm  Diastology LVIDs:         2.63 cm  LV e' lateral:   14.30 cm/s LV PW:         0.91 cm  LV E/e' lateral: 6.8 LV IVS:        0.56 cm  LV e' medial:    7.62 cm/s LVOT diam:     2.00 cm  LV E/e' medial:  12.7 LV SV:         70 LV SV Index:    34 LVOT Area:     3.14 cm   RIGHT VENTRICLE RV S prime:     15.40 cm/s TAPSE (M-mode): 2.4 cm  LEFT ATRIUM             Index       RIGHT ATRIUM           Index LA  diam:        3.60 cm 1.74 cm/m  RA Area:     17.10 cm LA Vol (A2C):   59.3 ml 28.71 ml/m RA Volume:   46.20 ml  22.37 ml/m LA Vol (A4C):   42.9 ml 20.77 ml/m LA Biplane Vol: 53.2 ml 25.76 ml/m AORTIC VALVE LVOT Vmax:   106.00 cm/s LVOT Vmean:  65.000 cm/s LVOT VTI:    0.222 m  AORTA Ao Root diam: 3.30 cm Ao Asc diam:  3.45 cm  MITRAL VALVE MV Area (PHT): cm         SHUNTS MV Decel Time: 250 msec    Systemic VTI:  0.22 m MV E velocity: 97.05 cm/s  Systemic Diam: 2.00 cm MV A velocity: 83.20 cm/s MV E/A ratio:  1.17  Dorris Carnes MD Electronically signed by Dorris Carnes MD Signature Date/Time: 11/09/2019/5:45:49 PM    Final    MONITORS  LONG TERM MONITOR (3-14 DAYS) 12/24/2019  Narrative  Average heart rate - 68 bpm, sinus rhythm  Rare PVC's - likely representative of his symptoms  No afib, no pauses  Brief paroxysmal atrial tachycardia, 5 beats. Benign.  Overall reassuring monitor.  Candee Furbish, MD   CT SCANS  CT CORONARY MORPH W/CTA COR W/SCORE 11/30/2019  Addendum 11/30/2019  6:53 PM ADDENDUM REPORT: 11/30/2019 18:50  CLINICAL DATA:  47 year old with atypical chest pain  EXAM: Cardiac/Coronary  CTA  TECHNIQUE: The patient was scanned on a [employer].  FINDINGS: A 100 kV prospective scan was triggered in the descending thoracic aorta at 111 HU's. Axial non-contrast 3 mm slices were carried out through the heart. The data set was analyzed on a dedicated work station and scored using the [place]. Gantry rotation speed was 250 msecs and collimation was .6 mm. Beta blockade and 0.8 mg of sl NTG was given. The 3D data set was reconstructed in 5% intervals of the 67-82 % of the R-R cycle. Diastolic phases were analyzed on a dedicated work station using MPR, MIP and  VRT modes. The patient received 80 cc of contrast.  Aorta:  Normal size.  Mild arch calcification.  No dissection.  Aortic Valve:  Trileaflet.  No calcifications.  Coronary Arteries:  Normal coronary origin.  Right dominance.  RCA is a large dominant artery that gives rise to PDA and PLA. There is no plaque.  Left main is a large artery that gives rise to LAD and LCX arteries.  LAD is a large vessel that has no plaque. One large branching diagonal.  LCX is a non-dominant artery that gives rise to two large OM branches and third small caliber OM branch distally. There is no plaque.  Other findings:  Normal pulmonary vein drainage into the left atrium.  Normal left atrial appendage without a thrombus.  Normal size of the pulmonary artery.  Please see radiology report for non cardiac findings.  IMPRESSION: 1. Coronary calcium score of 0. This was 0 percentile for age and sex matched control.  2. Normal coronary origin with right dominance.  3. No evidence of CAD.  4. CAD-RADS 0. No evidence of CAD (0%). Consider non-atherosclerotic causes of chest pain.  5.  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  Candee Furbish, MD [facility]   Electronically Signed By: Candee Furbish MD On: 11/30/2019 18:50  Narrative EXAM: OVER-READ INTERPRETATION  CT CHEST  The following report is an over-read performed by radiologist Dr. Rolm Baptise of [facility]  on 11/30/2019. This over-read does not include interpretation of cardiac or coronary anatomy or pathology. The coronary CTA interpretation by the cardiologist is attached.  COMPARISON:  None.  FINDINGS: Vascular: Heart is normal size.  Aorta normal caliber.  Mediastinum/Nodes: No adenopathy  Lungs/Pleura: No confluent opacities or effusions.  Upper Abdomen: Imaging into the upper abdomen shows no acute findings.  Musculoskeletal: Chest wall soft tissues are unremarkable. No acute bony  abnormality.  IMPRESSION: No acute or significant extracardiac abnormality.  Electronically Signed: By: Rolm Baptise M.D. On: 11/30/2019 16:54             Recent Labs: No results found for requested labs within last 365 days.  Recent Lipid Panel No results found for: "CHOL", "TRIG", "HDL", "CHOLHDL", "VLDL", "LDLCALC", "LDLDIRECT"  Physical Exam:    VS:  BP 122/78   Pulse 77   Ht '5\' 9"'$  (1.753 m)   Wt 206 lb 9.6 oz (93.7 kg)   SpO2 99%   BMI 30.51 kg/m     Wt Readings from Last 3 Encounters:  09/19/22 206 lb 9.6 oz (93.7 kg)  07/28/20 201 lb 1 oz (91.2 kg)  12/24/19 201 lb (91.2 kg)     GEN:  Well nourished, well developed in no acute distress HEENT: Normal NECK: No JVD; No carotid bruits LYMPHATICS: No lymphadenopathy CARDIAC: RRR, no murmurs, rubs, gallops RESPIRATORY:  Clear to auscultation without rales, wheezing or rhonchi  ABDOMEN: Soft, non-tender, non-distended MUSCULOSKELETAL: See below SKIN: Warm and dry NEUROLOGIC:  Alert and oriented x 3 PSYCHIATRIC:  Normal affect   ASSESSMENT:    1. Edema, unspecified type   2. Palpitation   3. Varicose veins of calf     PLAN:    In order of problems listed above:  Leg swelling/pain -Ecchymosis and varicose vein right-sided leg.  Continued intermittent right lower extremity swelling, ankle edema.  Noticeable today.  I will check a lower extremity venous Doppler.  I will also refer him to vascular and vein specialist for opinion on potential therapy.  In the meantime conservative measures with compression hose elevation walking etc. --EF was normal  Palpitations -PACs/PVCs noted on monitor.  No adverse arrhythmias.  Average heart rate - 68 bpm, sinus rhythm Rare PVC's - likely representative of his symptoms No afib, no pauses Brief paroxysmal atrial tachycardia, 5 beats. Benign. Overall reassuring monitor.   Atypical chest pain  -IMPRESSION: 1. Coronary calcium score of 0. This was 0 percentile for  age and sex matched control.   2. Normal coronary origin with right dominance.   3. No evidence of CAD.   4. CAD-RADS 0. No evidence of CAD (0%). Consider non-atherosclerotic causes of chest pain.   5.  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   Essential hypertension -Off of Amlodipine.  Doing well.  122/78 today.   Medication Adjustments/Labs and Tests Ordered: Current medicines are reviewed at length with the patient today.  Concerns regarding medicines are outlined above.  Orders Placed This Encounter  Procedures   Ambulatory referral to Vascular Surgery   EKG 12-Lead   VAS [place] LOWER EXTREMITY VENOUS (DVT)   No orders of the defined types were placed in this encounter.   Patient Instructions  Medication Instructions:   Your physician recommends that you continue on your current medications as directed. Please refer to the Current Medication list given to you today.  *If you need a refill on your cardiac medications before your next appointment, please call your pharmacy*  Lab Work:  None ordered.  If you have labs (blood work) drawn today and your tests are completely normal, you will receive your results only by: [facility] (if you have MyChart) OR A paper copy in the mail If you have any lab test that is abnormal or we need to change your treatment, we will call you to review the results.   Testing/Procedures:  Your physician has requested that you have a lower or upper extremity venous duplex. This test is an ultrasound of the veins in the legs or arms. It looks at venous blood flow that carries blood from the heart to the legs or arms. Allow one hour for a Lower Venous exam. Allow thirty minutes for an Upper Venous exam. There are no restrictions or special instructions.   Follow-Up: At [facility], you and your health needs are our priority.  As part of our continuing mission to provide you with exceptional heart care,  we have created designated Provider Care Teams.  These Care Teams include your primary Cardiologist (physician) and Advanced Practice Providers (APPs -  Physician Assistants and Nurse Practitioners) who all work together to provide you with the care you need, when you need it.  We recommend signing up for the patient portal called "MyChart".  Sign up information is provided on this After Visit Summary.  MyChart is used to connect with patients for Virtual Visits (Telemedicine).  Patients are able to view lab/test results, encounter notes, upcoming appointments, etc.  Non-urgent messages can be sent to your provider as well.   To learn more about what you can do with MyChart, go to [email or web address].    Your next appointment:   1 year(s)  Provider:   Candee Furbish, MD     Other Instructions  Your physician wants you to follow-up in: 1 year with Dr. Marlou Porch. You will receive a reminder letter in the mail two months in advance. If you don't receive a letter, please call our office to schedule the follow-up appointment.  You have been referred to Vein and Vascular.  You should be hearing from the office in the next several weeks to schedule your appointment.     For your  leg edema you  should do  the following 1. Leg elevation - I recommend the Lounge Dr. Leg rest.  See below for details  2. Salt restriction  -  Use potassium chloride instead of regular salt as a salt substitute. 3. Walk regularly 4. Compression hose - [employer] store  5. Weight loss    Available on [email or web address] Or  Go to [email or web address]          Signed, Candee Furbish, MD  09/19/2022 1:21 PM    [facility]

## 2022-09-21 ENCOUNTER — Telehealth: Payer: Self-pay | Admitting: Cardiology

## 2022-09-21 ENCOUNTER — Ambulatory Visit (HOSPITAL_COMMUNITY)
Admission: RE | Admit: 2022-09-21 | Discharge: 2022-09-21 | Disposition: A | Discharge status: Home / Self Care | Payer: BC Managed Care – PPO | Source: Ambulatory Visit | Attending: Vascular Surgery | Admitting: Vascular Surgery

## 2022-09-21 DIAGNOSIS — R002 Palpitations: Secondary | ICD-10-CM | POA: Diagnosis not present

## 2022-09-21 DIAGNOSIS — R609 Edema, unspecified: Secondary | ICD-10-CM | POA: Insufficient documentation

## 2022-09-21 NOTE — Telephone Encounter (Signed)
Megan calling from [facility] to report patient is negative for DVT in bilateral legs. She is reporting this results because there was a note in the referral to call main [place] office and report results to Dr. Marlou Porch. Result noted and forward to Dr.Skains and his nurse.

## 2022-09-21 NOTE — Telephone Encounter (Signed)
Caller reporting results LE Vein Duplex

## 2022-09-25 ENCOUNTER — Emergency Department (HOSPITAL_BASED_OUTPATIENT_CLINIC_OR_DEPARTMENT_OTHER): Payer: BC Managed Care – PPO

## 2022-09-25 ENCOUNTER — Other Ambulatory Visit: Payer: Self-pay

## 2022-09-25 ENCOUNTER — Encounter (HOSPITAL_COMMUNITY): Payer: Self-pay | Admitting: Family Medicine

## 2022-09-25 ENCOUNTER — Other Ambulatory Visit (HOSPITAL_COMMUNITY): Payer: Self-pay | Admitting: Family Medicine

## 2022-09-25 DIAGNOSIS — M7981 Nontraumatic hematoma of soft tissue: Secondary | ICD-10-CM | POA: Diagnosis not present

## 2022-09-25 DIAGNOSIS — Z7982 Long term (current) use of aspirin: Secondary | ICD-10-CM | POA: Insufficient documentation

## 2022-09-25 DIAGNOSIS — M7989 Other specified soft tissue disorders: Secondary | ICD-10-CM

## 2022-09-25 DIAGNOSIS — S8991XA Unspecified injury of right lower leg, initial encounter: Secondary | ICD-10-CM | POA: Diagnosis not present

## 2022-09-25 DIAGNOSIS — I83891 Varicose veins of right lower extremities with other complications: Secondary | ICD-10-CM | POA: Diagnosis not present

## 2022-09-25 DIAGNOSIS — Z6831 Body mass index (BMI) 31.0-31.9, adult: Secondary | ICD-10-CM | POA: Diagnosis not present

## 2022-09-25 DIAGNOSIS — I83811 Varicose veins of right lower extremities with pain: Secondary | ICD-10-CM | POA: Diagnosis not present

## 2022-09-25 DIAGNOSIS — I8391 Asymptomatic varicose veins of right lower extremity: Secondary | ICD-10-CM | POA: Diagnosis not present

## 2022-09-25 DIAGNOSIS — M79604 Pain in right leg: Secondary | ICD-10-CM | POA: Diagnosis not present

## 2022-09-25 LAB — CBC
HCT: 42.5 % (ref 39.0–52.0)
Hemoglobin: 13.8 g/dL (ref 13.0–17.0)
MCH: 27.1 pg (ref 26.0–34.0)
MCHC: 32.5 g/dL (ref 30.0–36.0)
MCV: 83.5 fL (ref 80.0–100.0)
Platelets: 290 10*3/uL (ref 150–400)
RBC: 5.09 MIL/uL (ref 4.22–5.81)
RDW: 13 % (ref 11.5–15.5)
WBC: 7.9 10*3/uL (ref 4.0–10.5)
nRBC: 0 % (ref 0.0–0.2)

## 2022-09-25 LAB — BASIC METABOLIC PANEL
Anion gap: 6 (ref 5–15)
BUN: 16 mg/dL (ref 6–20)
CO2: 30 mmol/L (ref 22–32)
Calcium: 8.6 mg/dL — ABNORMAL LOW (ref 8.9–10.3)
Chloride: 101 mmol/L (ref 98–111)
Creatinine, Ser: 1.11 mg/dL (ref 0.61–1.24)
GFR, Estimated: >60 mL/min (ref >60)
Glucose, Bld: 124 mg/dL — ABNORMAL HIGH (ref 70–99)
Potassium: 3.7 mmol/L (ref 3.5–5.1)
Sodium: 137 mmol/L (ref 135–145)

## 2022-09-25 NOTE — ED Triage Notes (Signed)
Pt reports falling off stool earlier this month and reports his leg hit the stool where he has multiple varicose veins.  Pt has swelling to right calf and foot that started last Friday. Pt has good right pedal pulse and reports normal sensation. Pt has had bruising to leg and foot.

## 2022-09-26 ENCOUNTER — Emergency Department (HOSPITAL_BASED_OUTPATIENT_CLINIC_OR_DEPARTMENT_OTHER): Payer: BC Managed Care – PPO

## 2022-09-26 ENCOUNTER — Encounter: Payer: BC Managed Care – PPO | Admitting: Vascular Surgery

## 2022-09-26 ENCOUNTER — Emergency Department (HOSPITAL_BASED_OUTPATIENT_CLINIC_OR_DEPARTMENT_OTHER)
Admission: EM | Admit: 2022-09-26 | Discharge: 2022-09-26 | Disposition: A | Discharge status: Home / Self Care | Payer: BC Managed Care – PPO | Attending: Emergency Medicine | Admitting: Emergency Medicine

## 2022-09-26 DIAGNOSIS — M7989 Other specified soft tissue disorders: Secondary | ICD-10-CM | POA: Diagnosis not present

## 2022-09-26 DIAGNOSIS — R58 Hemorrhage, not elsewhere classified: Secondary | ICD-10-CM

## 2022-09-26 DIAGNOSIS — I8390 Asymptomatic varicose veins of unspecified lower extremity: Secondary | ICD-10-CM

## 2022-09-26 MED ORDER — ASPIRIN 81 MG PO CHEW
81.0000 mg | CHEWABLE_TABLET | Freq: Once | ORAL | Status: AC
  Administered 2022-09-26: 81 mg via ORAL
  Filled 2022-09-26: qty 1

## 2022-09-26 MED ORDER — ASPIRIN 81 MG PO CHEW: 81.0000 mg | CHEWABLE_TABLET | Freq: Every day | ORAL | 0 refills | Status: AC

## 2022-09-26 NOTE — ED Provider Notes (Signed)
[place] [facility] Provider Note   CSN: [id number] Arrival date & time: 09/25/22  2002     History  Chief Complaint  Patient presents with   Leg Swelling    Ian Day is a 47 y.o. male.  The history is provided by the patient.  Illness Location:  Medial posterior knee area RLE Quality:  Swelling Severity:  Moderate Onset quality:  Sudden Duration:  10 days Timing:  Constant Progression:  Unchanged Chronicity:  New Context:  Patient struck medial to posterior right knee area on stool Relieved by:  Nothing Worsened by:  Nothing Ineffective treatments:  Tylenol Associated symptoms: no chest pain, no fever and no wheezing   Risk factors:  None Patient presents with RLE swelling and bruising following striking R medial posterior knee area on stool 10 days ago.  He has been taking tylenol and using compression socks.  Swelling and bruising in the R foot are worse since being the waiting room.  Patient denies any new trauma since 10 days ago.       Home Medications Prior to Admission medications   Medication Sig Start Date End Date Taking? Authorizing Provider  aspirin 81 MG chewable tablet Chew 1 tablet (81 mg total) by mouth daily. 09/26/22  Yes Orvan Papadakis, MD  AMLODIPINE BESYLATE PO Take 5 mg by mouth.    [provider]      Allergies    Patient has no known allergies.    Review of Systems   Review of Systems  Constitutional:  Negative for fever.  HENT:  Negative for drooling.   Eyes:  Negative for redness.  Respiratory:  Negative for wheezing and stridor.   Cardiovascular:  Positive for leg swelling. Negative for chest pain.  All other systems reviewed and are negative.   Physical Exam Updated Vital Signs BP (!) 148/100   Pulse 68   Temp 97.8 F (36.6 C)   Resp 18   SpO2 99%  Physical Exam Vitals reviewed.  Constitutional:      General: He is not in acute distress.    Appearance: Normal  appearance. He is well-developed. He is not diaphoretic.  HENT:     Head: Normocephalic and atraumatic.     Nose: Nose normal.  Eyes:     Conjunctiva/sclera: Conjunctivae normal.     Pupils: Pupils are equal, round, and reactive to light.  Cardiovascular:     Rate and Rhythm: Normal rate and regular rhythm.     Pulses: Normal pulses.     Heart sounds: Normal heart sounds.  Pulmonary:     Effort: Pulmonary effort is normal.     Breath sounds: Normal breath sounds. No wheezing or rales.  Abdominal:     General: Bowel sounds are normal.     Palpations: Abdomen is soft.     Tenderness: There is no abdominal tenderness. There is no guarding or rebound.  Musculoskeletal:        General: Normal range of motion.     Cervical back: Normal range of motion and neck supple.     Right lower leg: Swelling present.     Right ankle: No deformity or lacerations. Normal range of motion. Anterior drawer test negative.     Right Achilles Tendon: Normal.     Left ankle:     Left Achilles Tendon: Normal.     Right foot: Normal range of motion and normal capillary refill. No deformity, bunion, Charcot foot, foot drop,  prominent metatarsal heads, laceration, tenderness, bony tenderness or crepitus. Normal pulse.     Comments: R foot with swelling and ecchymosis purple laterally and yellowing on the dorsum of the foot.  Varicose veins medial proximal calf   Skin:    General: Skin is warm and dry.     Capillary Refill: Capillary refill takes less than 2 seconds.  Neurological:     General: No focal deficit present.     Mental Status: He is alert and oriented to person, place, and time.     Deep Tendon Reflexes: Reflexes normal.  Psychiatric:        Mood and Affect: Mood normal.        Behavior: Behavior normal.     ED Results / Procedures / Treatments   Labs (all labs ordered are listed, but only abnormal results are displayed) Results for orders placed or performed during the hospital encounter of  09/26/22  CBC  Result Value Ref Range   WBC 7.9 4.0 - 10.5 K/uL   RBC 5.09 4.22 - 5.81 MIL/uL   Hemoglobin 13.8 13.0 - 17.0 g/dL   HCT 42.5 39.0 - 52.0 %   MCV 83.5 80.0 - 100.0 fL   MCH 27.1 26.0 - 34.0 pg   MCHC 32.5 30.0 - 36.0 g/dL   RDW 13.0 11.5 - 15.5 %   Platelets 290 150 - 400 K/uL   nRBC 0.0 0.0 - 0.2 %  Basic metabolic panel  Result Value Ref Range   Sodium 137 135 - 145 mmol/L   Potassium 3.7 3.5 - 5.1 mmol/L   Chloride 101 98 - 111 mmol/L   CO2 30 22 - 32 mmol/L   Glucose, Bld 124 (H) 70 - 99 mg/dL   BUN 16 6 - 20 mg/dL   Creatinine, Ser 1.11 0.61 - 1.24 mg/dL   Calcium 8.6 (L) 8.9 - 10.3 mg/dL   GFR, Estimated >60 >60 mL/min   Anion gap 6 5 - 15   DG Foot Complete Right  Result Date: 09/26/2022 CLINICAL DATA:  Bruising after fall 10 days ago. EXAM: RIGHT TIBIA AND FIBULA - 2 VIEW; RIGHT FOOT COMPLETE - 3+ VIEW COMPARISON:  None Available. FINDINGS: Tibia and fibula: There is no evidence of fracture or other focal bone lesions. Mild degenerative changes are noted in the patellofemoral compartment at the knee. Mild diffuse soft tissue swelling is noted. Right foot: No acute fracture or dislocation. Mild soft tissue swelling is present over the dorsum of the forefoot. IMPRESSION: No acute fracture or dislocation. Electronically Signed   By: Brett Fairy M.D.   On: 09/26/2022 01:46   DG Tibia/Fibula Right  Result Date: 09/26/2022 CLINICAL DATA:  Bruising after fall 10 days ago. EXAM: RIGHT TIBIA AND FIBULA - 2 VIEW; RIGHT FOOT COMPLETE - 3+ VIEW COMPARISON:  None Available. FINDINGS: Tibia and fibula: There is no evidence of fracture or other focal bone lesions. Mild degenerative changes are noted in the patellofemoral compartment at the knee. Mild diffuse soft tissue swelling is noted. Right foot: No acute fracture or dislocation. Mild soft tissue swelling is present over the dorsum of the forefoot. IMPRESSION: No acute fracture or dislocation. Electronically Signed   By:  Brett Fairy M.D.   On: 09/26/2022 01:46   US Venous Img Lower Unilateral Right  Result Date: 09/25/2022 CLINICAL DATA:  Right leg firm lumps for 1-2 days after falling off stool EXAM: Right LOWER EXTREMITY VENOUS DOPPLER ULTRASOUND TECHNIQUE: Gray-scale sonography with compression, as well as  color and duplex ultrasound, were performed to evaluate the deep venous system(s) from the level of the common femoral vein through the popliteal and proximal calf veins. COMPARISON:  None Available. FINDINGS: VENOUS Normal compressibility of the common femoral, superficial femoral, and popliteal veins, as well as the visualized calf veins. Visualized portions of profunda femoral vein and great saphenous vein unremarkable. No filling defects to suggest DVT on grayscale or color Doppler imaging. Doppler waveforms show normal direction of venous flow, normal respiratory plasticity and response to augmentation. Limited views of the contralateral common femoral vein are unremarkable. OTHER In the areas of palpable concern in the right posterosuperior calf and right leg medial to the knee there are superficial varicosities some of which contain thrombus. Limitations: none IMPRESSION: 1. There is no evidence of deep venous thrombosis in the right lower extremity. 2. In the right posterosuperior calf and in the right leg medial to the knee there are superficial varicosities containing thrombus. Recommend correlation for superficial thrombophlebitis. Electronically Signed   By: Placido Sou M.D.   On: 09/25/2022 21:20   VAS [place] LOWER EXTREMITY VENOUS (DVT)  Result Date: 09/21/2022  Lower Venous DVT Study Patient Name:  VIRDEN MCLAFFERTY  Date of Exam:   09/21/2022 Medical Rec #: [id number]         Accession #:    [id number] Date of Birth: Jul 26, 1975        Patient Gender: M Patient Age:   57 years Exam Location:  [facility] Procedure:      VAS [place] LOWER EXTREMITY VENOUS (DVT) Referring Phys: Candee Furbish  --------------------------------------------------------------------------------  Indications: Edema.  Comparison Study: no prior Performing Technologist: Archie Patten RVS  Examination Guidelines: A complete evaluation includes B-mode imaging, spectral Doppler, color Doppler, and power Doppler as needed of all accessible portions of each vessel. Bilateral testing is considered an integral part of a complete examination. Limited examinations for reoccurring indications may be performed as noted. The reflux portion of the exam is performed with the patient in reverse Trendelenburg.  +---------+---------------+---------+-----------+----------+--------------+ RIGHT    CompressibilityPhasicitySpontaneityPropertiesThrombus Aging +---------+---------------+---------+-----------+----------+--------------+ CFV      Full           Yes      Yes                                 +---------+---------------+---------+-----------+----------+--------------+ SFJ      Full                                                        +---------+---------------+---------+-----------+----------+--------------+ FV Prox  Full                                                        +---------+---------------+---------+-----------+----------+--------------+ FV Mid   Full                                                        +---------+---------------+---------+-----------+----------+--------------+  FV DistalFull                                                        +---------+---------------+---------+-----------+----------+--------------+ PFV      Full                                                        +---------+---------------+---------+-----------+----------+--------------+ POP      Full           Yes      Yes                                 +---------+---------------+---------+-----------+----------+--------------+ PTV      Full                                                         +---------+---------------+---------+-----------+----------+--------------+ PERO     Full           Yes      Yes                                 +---------+---------------+---------+-----------+----------+--------------+   +---------+---------------+---------+-----------+----------+--------------+ LEFT     CompressibilityPhasicitySpontaneityPropertiesThrombus Aging +---------+---------------+---------+-----------+----------+--------------+ CFV      Full           Yes      Yes                                 +---------+---------------+---------+-----------+----------+--------------+ SFJ      Full                                                        +---------+---------------+---------+-----------+----------+--------------+ FV Prox  Full                                                        +---------+---------------+---------+-----------+----------+--------------+ FV Mid   Full                                                        +---------+---------------+---------+-----------+----------+--------------+ FV DistalFull                                                        +---------+---------------+---------+-----------+----------+--------------+  PFV      Full                                                        +---------+---------------+---------+-----------+----------+--------------+ POP      Full           Yes      Yes                                 +---------+---------------+---------+-----------+----------+--------------+ PTV      Full                                                        +---------+---------------+---------+-----------+----------+--------------+ PERO     Full           Yes      Yes                                 +---------+---------------+---------+-----------+----------+--------------+     Summary: BILATERAL: - No evidence of deep vein thrombosis seen in the lower extremities, bilaterally. -No  evidence of popliteal cyst, bilaterally.   *See table(s) above for measurements and observations. Electronically signed by Orlie Pollen on 09/21/2022 at 9:36:04 AM.    Final      Radiology DG Foot Complete Right  Result Date: 09/26/2022 CLINICAL DATA:  Bruising after fall 10 days ago. EXAM: RIGHT TIBIA AND FIBULA - 2 VIEW; RIGHT FOOT COMPLETE - 3+ VIEW COMPARISON:  None Available. FINDINGS: Tibia and fibula: There is no evidence of fracture or other focal bone lesions. Mild degenerative changes are noted in the patellofemoral compartment at the knee. Mild diffuse soft tissue swelling is noted. Right foot: No acute fracture or dislocation. Mild soft tissue swelling is present over the dorsum of the forefoot. IMPRESSION: No acute fracture or dislocation. Electronically Signed   By: Brett Fairy M.D.   On: 09/26/2022 01:46   DG Tibia/Fibula Right  Result Date: 09/26/2022 CLINICAL DATA:  Bruising after fall 10 days ago. EXAM: RIGHT TIBIA AND FIBULA - 2 VIEW; RIGHT FOOT COMPLETE - 3+ VIEW COMPARISON:  None Available. FINDINGS: Tibia and fibula: There is no evidence of fracture or other focal bone lesions. Mild degenerative changes are noted in the patellofemoral compartment at the knee. Mild diffuse soft tissue swelling is noted. Right foot: No acute fracture or dislocation. Mild soft tissue swelling is present over the dorsum of the forefoot. IMPRESSION: No acute fracture or dislocation. Electronically Signed   By: Brett Fairy M.D.   On: 09/26/2022 01:46   US Venous Img Lower Unilateral Right  Result Date: 09/25/2022 CLINICAL DATA:  Right leg firm lumps for 1-2 days after falling off stool EXAM: Right LOWER EXTREMITY VENOUS DOPPLER ULTRASOUND TECHNIQUE: Gray-scale sonography with compression, as well as color and duplex ultrasound, were performed to evaluate the deep venous system(s) from the level of the common femoral vein through the popliteal and proximal calf veins. COMPARISON:  None Available.  FINDINGS: VENOUS Normal compressibility of the common femoral, superficial femoral, and popliteal veins, as well as  the visualized calf veins. Visualized portions of profunda femoral vein and great saphenous vein unremarkable. No filling defects to suggest DVT on grayscale or color Doppler imaging. Doppler waveforms show normal direction of venous flow, normal respiratory plasticity and response to augmentation. Limited views of the contralateral common femoral vein are unremarkable. OTHER In the areas of palpable concern in the right posterosuperior calf and right leg medial to the knee there are superficial varicosities some of which contain thrombus. Limitations: none IMPRESSION: 1. There is no evidence of deep venous thrombosis in the right lower extremity. 2. In the right posterosuperior calf and in the right leg medial to the knee there are superficial varicosities containing thrombus. Recommend correlation for superficial thrombophlebitis. Electronically Signed   By: Placido Sou M.D.   On: 09/25/2022 21:20    Procedures Procedures    Medications Ordered in ED Medications  aspirin chewable tablet 81 mg (81 mg Oral Given 09/26/22 0139)    ED Course/ Medical Decision Making/ A&P                             Medical Decision Making Patient with injury to knee area 10 days ago, patient is taking tylenol that is not helping and swelling and bruising of the foot are worse since being in the waiting room.  No new trauma   Amount and/or Complexity of Data Reviewed Independent Historian: spouse    Details: See above  External Data Reviewed: notes.    Details: Previous notes reivewed  Labs: ordered.    Details: All labs reviewed.  Normal white count 7.9, normal hemoglobin 13.8 normal platelet count 290.  Normal sodium 137, normal potassium 3.7, normal creatinine 1.11 Radiology: ordered and independent interpretation performed.    Details: No DVT on [place],  foot and tib fib Xrays negative for  fracture   Risk OTC drugs. Risk Details: I was initially informed patient was on aspirin when I spoke to patient and wife about results and plan of care ([place] and labs resulted in waiting room). I was then informed patient was actually taking tylenol and not aspirin since the event.  Given their concerns for worsening swelling Xrays were ordered to ensure no fractures causing swelling and bruising that has progressed.  Patient has been ruled out for DVT in the ED.  Also no fractures in the foot or tibia/fibula.  I suspect the bruising and swelling is progressed secondary to the foot having been dependent in the while the patient is waiting, in the absence of new trauma.  I am starting Aspirin therapy as the treatment for varicose veins with clot in them.  Compression hose to the level of the waist has also been prescribed.  With nurse Ranette present I went over all results with patient and wife for a second time and answered all questions regarding bruising and varicose veins to the RLE.  I answered all questions to the patient's and his wife's satisfaction explained that we would be starting a daily baby aspirin and I have prescribed compression hose and patient is to elevate the leg to bring down swelling.  I have offered a work note, patient declined.  Stable for discharge.  Recheck with PMD within one week.  Strict return.     Final Clinical Impression(s) / ED Diagnoses Final diagnoses:  Varicose veins of calf  Ecchymosis   Return for intractable cough, coughing up blood, fevers > 100.4 unrelieved by medication, shortness of  breath, intractable vomiting, chest pain, shortness of breath, weakness, numbness, changes in speech, facial asymmetry, abdominal pain, passing out, Inability to tolerate liquids or food, cough, altered mental status or any concerns. No signs of systemic illness or infection. The patient is nontoxic-appearing on exam and vital signs are within normal limits.  I have reviewed the  triage vital signs and the nursing notes. Pertinent labs & imaging results that were available during my care of the patient were reviewed by me and considered in my medical decision making (see chart for details). After history, exam, and medical workup I feel the patient has been appropriately medically screened and is safe for discharge home. Pertinent diagnoses were discussed with the patient. Patient was given return precautions. Rx / DC Orders ED Discharge Orders          Ordered    Compression stockings        09/26/22 0107    aspirin 81 MG chewable tablet  Daily        09/26/22 0213              Alcie Runions, MD 09/26/22 [id number]

## 2022-09-26 NOTE — ED Notes (Signed)
ED Provider at bedside.  EDP answered all questions pt and spouse had and discussed treatment plan thoroughly.

## 2022-09-26 NOTE — ED Notes (Signed)
Ice pack given And applied to rt. Foot

## 2022-09-26 NOTE — ED Notes (Signed)
Pt discharged '@0244'$ 

## 2022-09-27 ENCOUNTER — Other Ambulatory Visit: Payer: Self-pay | Admitting: Family Medicine

## 2022-09-27 DIAGNOSIS — S8991XA Unspecified injury of right lower leg, initial encounter: Secondary | ICD-10-CM

## 2022-09-27 DIAGNOSIS — M7989 Other specified soft tissue disorders: Secondary | ICD-10-CM

## 2022-10-08 DIAGNOSIS — L570 Actinic keratosis: Secondary | ICD-10-CM | POA: Diagnosis not present

## 2022-10-08 DIAGNOSIS — L814 Other melanin hyperpigmentation: Secondary | ICD-10-CM | POA: Diagnosis not present

## 2022-10-08 DIAGNOSIS — D225 Melanocytic nevi of trunk: Secondary | ICD-10-CM | POA: Diagnosis not present

## 2022-10-08 DIAGNOSIS — L821 Other seborrheic keratosis: Secondary | ICD-10-CM | POA: Diagnosis not present

## 2022-10-08 DIAGNOSIS — Z08 Encounter for follow-up examination after completed treatment for malignant neoplasm: Secondary | ICD-10-CM | POA: Diagnosis not present

## 2023-04-23 DIAGNOSIS — Z08 Encounter for follow-up examination after completed treatment for malignant neoplasm: Secondary | ICD-10-CM | POA: Diagnosis not present

## 2023-04-23 DIAGNOSIS — L538 Other specified erythematous conditions: Secondary | ICD-10-CM | POA: Diagnosis not present

## 2023-04-23 DIAGNOSIS — L821 Other seborrheic keratosis: Secondary | ICD-10-CM | POA: Diagnosis not present

## 2023-04-23 DIAGNOSIS — L2989 Other pruritus: Secondary | ICD-10-CM | POA: Diagnosis not present

## 2023-04-23 DIAGNOSIS — L570 Actinic keratosis: Secondary | ICD-10-CM | POA: Diagnosis not present

## 2023-04-23 DIAGNOSIS — Z789 Other specified health status: Secondary | ICD-10-CM | POA: Diagnosis not present

## 2023-04-23 DIAGNOSIS — L820 Inflamed seborrheic keratosis: Secondary | ICD-10-CM | POA: Diagnosis not present

## 2023-04-23 DIAGNOSIS — L814 Other melanin hyperpigmentation: Secondary | ICD-10-CM | POA: Diagnosis not present

## 2023-04-23 DIAGNOSIS — D225 Melanocytic nevi of trunk: Secondary | ICD-10-CM | POA: Diagnosis not present
